# Patient Record
Sex: Female | Born: 1960 | Race: White | Hispanic: No | Smoking: Current every day smoker
Health system: Southern US, Community
[De-identification: ages and names within clinical notes are randomized; demographics above are authoritative.]

## PROBLEM LIST (undated history)

## (undated) DIAGNOSIS — F329 Major depressive disorder, single episode, unspecified: Secondary | ICD-10-CM

## (undated) DIAGNOSIS — G2581 Restless legs syndrome: Secondary | ICD-10-CM

## (undated) DIAGNOSIS — F32A Depression, unspecified: Secondary | ICD-10-CM

## (undated) DIAGNOSIS — J449 Chronic obstructive pulmonary disease, unspecified: Secondary | ICD-10-CM

## (undated) DIAGNOSIS — Z9889 Other specified postprocedural states: Secondary | ICD-10-CM

## (undated) HISTORY — DX: Major depressive disorder, single episode, unspecified: F32.9

## (undated) HISTORY — DX: Depression, unspecified: F32.A

## (undated) HISTORY — PX: CHOLECYSTECTOMY: SHX55

## (undated) HISTORY — DX: Other specified postprocedural states: Z98.890

## (undated) HISTORY — DX: Chronic obstructive pulmonary disease, unspecified: J44.9

## (undated) HISTORY — PX: OTHER SURGICAL HISTORY: SHX169

## (undated) HISTORY — PX: TUBAL LIGATION: SHX77

---

## 2008-02-03 ENCOUNTER — Emergency Department (HOSPITAL_COMMUNITY): Admission: EM | Admit: 2008-02-03 | Discharge: 2008-02-03 | Payer: Self-pay | Admitting: Emergency Medicine

## 2011-04-14 ENCOUNTER — Encounter: Payer: Self-pay | Admitting: *Deleted

## 2011-04-14 DIAGNOSIS — K5900 Constipation, unspecified: Secondary | ICD-10-CM | POA: Insufficient documentation

## 2011-04-14 DIAGNOSIS — Z5320 Procedure and treatment not carried out because of patient's decision for unspecified reasons: Secondary | ICD-10-CM | POA: Insufficient documentation

## 2011-04-14 LAB — GLUCOSE, CAPILLARY: Glucose-Capillary: 242 mg/dL — ABNORMAL HIGH (ref 70–99)

## 2011-04-14 NOTE — ED Notes (Signed)
Reports no BM x 3 weeks; was seen and tx at another ER for same; states has appt with GI specialist here in 2 days, but cannot wait d/t upper abd pain

## 2011-04-15 ENCOUNTER — Emergency Department (HOSPITAL_COMMUNITY)
Admission: EM | Admit: 2011-04-15 | Discharge: 2011-04-15 | Discharge status: Against Medical Advice | Payer: Self-pay | Attending: *Deleted | Admitting: *Deleted

## 2011-04-15 NOTE — ED Notes (Signed)
Unable to locate pt in all waiting  Areas.

## 2011-04-16 ENCOUNTER — Encounter: Payer: Self-pay | Admitting: Gastroenterology

## 2011-04-16 ENCOUNTER — Ambulatory Visit (INDEPENDENT_AMBULATORY_CARE_PROVIDER_SITE_OTHER): Payer: Self-pay | Admitting: Gastroenterology

## 2011-04-16 DIAGNOSIS — K5900 Constipation, unspecified: Secondary | ICD-10-CM

## 2011-04-16 DIAGNOSIS — R109 Unspecified abdominal pain: Secondary | ICD-10-CM

## 2011-04-16 MED ORDER — LUBIPROSTONE 24 MCG PO CAPS: 24.0000 ug | ORAL_CAPSULE | Freq: Two times a day (BID) | ORAL | Status: DC

## 2011-04-16 MED ORDER — OMEPRAZOLE 20 MG PO CPDR: 20.0000 mg | DELAYED_RELEASE_CAPSULE | Freq: Every day | ORAL | Status: DC

## 2011-04-16 MED ORDER — LUBIPROSTONE 8 MCG PO CAPS: 8.0000 ug | ORAL_CAPSULE | Freq: Two times a day (BID) | ORAL | Status: DC

## 2011-04-16 MED ORDER — LUBIPROSTONE 24 MCG PO CAPS: 24.0000 ug | ORAL_CAPSULE | Freq: Two times a day (BID) | ORAL | Status: AC

## 2011-04-16 NOTE — Assessment & Plan Note (Signed)
Vague upper abdominal pain for past few months, now exacerbated with onset of constipation. Worsened with eating/drinking. Denies dysphagia or odynophagia. Nausea associated with constipation. At this time, may have underlying gastritis, PUD or symptoms may be stemming from increased stool burden. Will empirically place on PPI. Pt has no insurance, so we will go with Prilosec for now as it may be most cost-efficient. Labs are wnl from [place]. We will reassess discomfort in 6 weeks or sooner if needed. Once bowel habits are regulated, may have clearer picture of what is going on.

## 2011-04-16 NOTE — Patient Instructions (Addendum)
Stop Donnatal.   Start High fiber diet.  See handout. AVOID greasy, fatty foods and caffeine. Try to drink at least 6-8 glasses of water daily. This will greatly improve your bowel habits.  Start taking Prilosec 30 minutes before breakfast daily. Call us in 10 days if no improvement in your symptoms. Constipation may be aggravating these symptoms, but we will pursue further work-up if you have no change after taking the medicine and improving your bowel habits.  Start taking Amitiza 24 mcg with dinner for 3 nights, then increase to twice a day WITH FOOD TO AVOID NAUSEA. This dose can be increased if necessary, but you will need to contact us if you find you are not having adequate results.   Start taking a probiotic daily. You can get this over the counter [facility], [employer], [employer]). This will help your symptoms of constipation and bloating.  We will see you back in 6 weeks.

## 2011-04-16 NOTE — Progress Notes (Signed)
Cc to PCP 

## 2011-04-16 NOTE — Assessment & Plan Note (Signed)
50 year old Caucasian female with hx of chronic constipation in the past, now with recent 3 week episode of severe constipation/obstipation resulting in presentation to [facility] ED. AAS nl, labs essentially wnl. She has tried multiple agents in the past and recently to include mag citrate, miralax, MOM, prune juice. She admits to eating whatever she wants, poor water intake, drinks mainly caffeinated beverages such as coffee. She has had a colonoscopy in the past 2 years with Dr. Allena Katz in [place], [place]. These reports are being requested. +Abdominal bloating and distension have significantly improved since 2 small BMs in the past few days but still remains. No melena or brbpr. +Nausea. +upper abdominal discomfort, which has been present for the past few months prior to episode but has worsened since. May be r/t constipation discomfort, but please see notes under "abdominal pain". Constipation is most likely a result of dietary habits, lack of adequate bowel regimen. Pt is clinically improved since seen at [facility]. Will stop Donnatal for now as it is likely worsening constipation.   High fiber diet, may take supplements if desires 6-8 glasses of water daily. Avoid caffeinated beverages Stop Donnatal Begin Amitiza 24 mcg. See pt instructions Probiotic daily Contact us if increased abdominal pain, bloating, constipation Retrieve colonoscopy reports for our records Follow-up in 6 weeks

## 2011-04-16 NOTE — Progress Notes (Signed)
Referring Provider: No ref. provider found Primary Care Physician:  Reynolds Bowl, MD Primary Gastroenterologist:  Dr. Darrick Penna   Chief Complaint  Patient presents with  . Constipation    HPI:   Stephanie Khan is a 50 year old Caucasian female who presents at the request of [facility] secondary to severe constipation. She had a colonoscopy approximately 2 years ago by Dr. Allena Katz in [place], [place]. Reports are not available at this visit. Stephanie Khan reports a hx of chronic constipation in the past; however, she states that for the past 3 weeks she has had increasing constipation, abdominal bloating, upper abdominal discomfort, prompting a visit to [facility] for relief. She was given Donnatal for spasms. Labs drawn as well as AAS. Please see findings below. She states that she has had a small amount of relief in the past two days, passing 2 small bowel movements. No melena or brbpr. Abdominal distension significantly decreased, yet she is still uncomfortable. Slight nausea due to increased abdominal pressure secondary to constipation.   During this severe episode of constipation,she has tried mag citrate, MOM, miralax, prune juice, and enemas. She was unable to experience relief. As noted above, she did have 2 episodes of 2 small BMs with slight relief of symptoms. She is passing gas. No melena or brbpr.   She also complains of upper abdominal pain, worsened with eating/drinking. She noted this for the past few months, prior to constipation episode, but it has worsened since then. Denies dysphagia, odynophagia, or reflux. No PPI currently. No hx of PUD, denies NSAIDs or aspirin powders.  She does not appear in distress currently. She is quite pleasant.    AAS July 16: non-obstructed bowel gas pattern, no free air. Low lung volumes with atelectasis. Labs: Lipase 52, LFTs nl, Glucose 227, Hgb 12.7, WBC 7.8, hemoccult neg in ED  Past Medical History  Diagnosis Date  . Diabetes mellitus     . COPD (chronic obstructive pulmonary disease)   . Depression   . S/P colonoscopy     Dr. Allena Katz in [place] 2 years ago  . Asthma     Past Surgical History  Procedure Date  . Cholecystectomy   . Tubal ligation   . Back operation     L4-L5    Current Outpatient Prescriptions  Medication Sig Dispense Refill  . acetaminophen (TYLENOL) 500 MG tablet Take 500 mg by mouth daily as needed. For pain       . aspirin EC 81 MG tablet Take 81 mg by mouth daily.        . clonazePAM (KLONOPIN) 1 MG tablet Take 1 mg by mouth 2 (two) times daily as needed. For anxiety/depression       . glipiZIDE (GLUCOTROL XL) 10 MG 24 hr tablet Take 10 mg by mouth daily.        . metFORMIN (GLUCOPHAGE) 1000 MG tablet Take 1,000 mg by mouth 2 (two) times daily with a meal.        . sitaGLIPtin (JANUVIA) 100 MG tablet Take 100 mg by mouth daily.        Marland Kitchen lubiprostone (AMITIZA) 24 MCG capsule Take 1 capsule (24 mcg total) by mouth 2 (two) times daily with a meal. Start by taking 1 capsule WITH Food each evening for the first 3 days. Then, increase to taking once in morning WITH FOOD and once in evening WITH FOOD.  62 capsule  1  . omeprazole (PRILOSEC) 20 MG capsule Take 1 capsule (20 mg total) by mouth daily.  30 capsule  0    Allergies as of 04/16/2011  . (No Known Allergies)    Family History  Problem Relation Age of Onset  . Colon cancer Neg Hx     History   Social History  . Marital Status: Married    Spouse Name: N/A    Number of Children: N/A  . Years of Education: N/A   Occupational History  . [employer]     CNA    Social History Main Topics  . Smoking status: Current Everyday Smoker -- 1.0 packs/day for 35 years    Types: Cigarettes  . Smokeless tobacco: Not on file  . Alcohol Use: No  . Drug Use: No  . Sexually Active: Not on file    Review of Systems: Gen: Denies any fever, chills, loss of appetite, fatigue, weight loss. CV: Denies chest pain, heart palpitations,  syncope, peripheral edema. Resp: Denies shortness of breath with rest, cough, wheezing GI: Denies dysphagia or odynophagia. Denies hematemesis, fecal incontinence, or jaundice.  GU : Denies urinary burning, urinary frequency, urinary incontinence.  MS: Denies joint pain, muscle weakness, cramps, limited movement Derm: Denies rash, itching, dry skin Psych: Denies depression, anxiety, confusion or memory loss  Heme: Denies bruising, bleeding, and enlarged lymph nodes.  Physical Exam: BP 126/83  Pulse 84  Temp(Src) 97.6 F (36.4 C) (Temporal)  Ht 5\' 4"  (1.626 m)  Wt 226 lb 3.2 oz (102.604 kg)  BMI 38.83 kg/m2 General:   Alert and oriented. Well-developed, well-nourished, pleasant and cooperative. Obese.  Head:  Normocephalic and atraumatic. Eyes:  Conjunctiva pink, sclera clear, no icterus.   Conjunctiva pink. Ears:  Hearing aids bilaterally, decreased hearing.  Nose:  No deformity, discharge,  or lesions. Mouth:  No deformity or lesions, mucosa pink and moist.  Neck:  Supple, without mass or thyromegaly. Lungs:  Clear to auscultation bilaterally, without wheezing, rales, or rhonchi.  Heart:  S1, S2 present without murmurs noted.  Abdomen:  Obese, soft, tender to palpation upper abdomen, epigastric region. non-distended. Without mass or HSM. No rebound or guarding. No hernias noted. She has somewhat hypoactive BS.  Rectal:  External exam without signs of hemorrhoids; internal exam with good sphincter tone, no mass noted, no stool in rectal vault.  Msk:  Symmetrical without gross deformities. Normal posture. Extremities:  Without clubbing or edema. Neurologic:  Alert and  oriented x4;  grossly normal neurologically. Skin:  Intact, warm and dry without significant lesions or rashes Cervical Nodes:  No significant cervical adenopathy. Psych:  Alert and cooperative. Normal mood and affect.

## 2011-04-21 ENCOUNTER — Telehealth: Payer: Self-pay

## 2011-04-21 NOTE — Telephone Encounter (Signed)
Has she started the Amitiza?  Ask her: is she passing gas?               Abdominal bloating?               N/V?  Let me know what she says. We can do an AAS today to assess for any obstruction. I would like to hold on the Amitiza until we get results from AAS.   May use fleets enema X 1 to get things rolling.

## 2011-04-21 NOTE — Telephone Encounter (Signed)
Patient is still having very bad pain and can not go to the bathroom. She is able to pass very little stool. She thinks she need to have a test to see if she has something going on. She would like to be seen today to address the pain. Please advised

## 2011-04-21 NOTE — Telephone Encounter (Signed)
She is taking the Amitiza and passing gas,she does had abdominal bloating but no N/V. She stated that she did have a bowel movement after I talked to her this morning. She has pain after she eats in the top of her stomach. She has tried a soap sud enema last night with not results and this morning she used a fleets enema with average results. Want to know if an anti-bx would help her.

## 2011-04-21 NOTE — Telephone Encounter (Signed)
abx are not indicated in this scenario. We need to give the Amitiza time to work. Please inform pt that we also will probably be needing to perform an upper endoscopy if no improvement in the next week or so after changes in medication. Have her call us back Monday morning with PR.

## 2011-04-23 ENCOUNTER — Telehealth: Payer: Self-pay

## 2011-04-23 NOTE — Telephone Encounter (Signed)
Pt is taking the Prilosec as directed. She is not having any n/v. Just the abd pain. She has spoke with Lubertha Basque, but she will touch base with her and let us know what she says. She has never had an endoscopy.

## 2011-04-23 NOTE — Telephone Encounter (Signed)
Just want to verify: abdominal pain lower abdomen or upper abdomen?

## 2011-04-23 NOTE — Telephone Encounter (Signed)
Pt says she is hurting in her upper abdomen tight below her breasts.

## 2011-04-23 NOTE — Telephone Encounter (Signed)
Pt called and said she is still having the abdominal pain every time after she eats anything. She is taking the Prilosec, Amitiza and the probiotic as directed.  I told her that Gerrit Halls, NP had wanted her to try the meds for a couple of weeks and see if there was any improvement in her symptoms. Having a normal BM once a day now, Please advise!

## 2011-04-23 NOTE — Telephone Encounter (Signed)
Make sure she is taking the Prilosec 30 minutes before breakfast on an empty stomach daily. Is she still nauseated? I don't believe she has ever had an upper endoscopy. We may need to set her up for this due to new onset dyspepsia.  Does she have insurance? Needs to speak to [facility] if not. In the interim, avoid spicy foods, things that aggravated. Stay away from fatty foods.

## 2011-04-24 ENCOUNTER — Other Ambulatory Visit: Payer: Self-pay

## 2011-04-24 NOTE — Telephone Encounter (Signed)
Routing to Anna Sams, NP. 

## 2011-04-24 NOTE — Telephone Encounter (Signed)
I would like to get a CT abd for her, updated lipase. LFTs were normal. She will need an EGD if CT is negative. She is having no dysphagia or reflux from what I remember.  She needs to get this approved through the [place] discount. It would be good to have it done soon if possible, for our review.

## 2011-04-27 NOTE — Progress Notes (Signed)
AGREE

## 2011-05-04 NOTE — Telephone Encounter (Signed)
Called pt, many rings and no answer.  

## 2011-05-06 NOTE — Telephone Encounter (Signed)
LMOM to call.

## 2011-05-11 NOTE — Telephone Encounter (Signed)
LMOM to call. Mailing a letter also to call.  

## 2011-05-26 ENCOUNTER — Ambulatory Visit: Payer: Self-pay | Admitting: Gastroenterology

## 2011-05-26 ENCOUNTER — Telehealth: Payer: Self-pay | Admitting: Gastroenterology

## 2011-05-29 NOTE — Telephone Encounter (Signed)
Routed to provider

## 2011-06-17 LAB — DIFFERENTIAL
Basophils Absolute: 0
Eosinophils Relative: 3
Lymphocytes Relative: 31
Lymphs Abs: 2.6
Monocytes Absolute: 0.6
Monocytes Relative: 7

## 2011-06-17 LAB — URINALYSIS, ROUTINE W REFLEX MICROSCOPIC
Glucose, UA: NEGATIVE
Leukocytes, UA: NEGATIVE
pH: 5

## 2011-06-17 LAB — CBC
MCV: 87.5
Platelets: 225
WBC: 8.6

## 2011-06-17 LAB — URINE MICROSCOPIC-ADD ON

## 2011-06-17 LAB — COMPREHENSIVE METABOLIC PANEL
AST: 32
Albumin: 3.6
Chloride: 107
Creatinine, Ser: 0.52
GFR calc Af Amer: >60
Total Bilirubin: 0.4
Total Protein: 6.4

## 2011-06-23 NOTE — Telephone Encounter (Signed)
First no-show. Please offer f/u appt letter.

## 2011-06-29 ENCOUNTER — Encounter: Payer: Self-pay | Admitting: Gastroenterology

## 2011-06-29 NOTE — Telephone Encounter (Signed)
Mailed reminder letter for pt to call and set up OV

## 2015-12-09 ENCOUNTER — Encounter (INDEPENDENT_AMBULATORY_CARE_PROVIDER_SITE_OTHER): Payer: Self-pay | Admitting: *Deleted

## 2015-12-18 ENCOUNTER — Other Ambulatory Visit (HOSPITAL_COMMUNITY)
Admission: RE | Admit: 2015-12-18 | Discharge: 2015-12-18 | Disposition: A | Discharge status: Home / Self Care | Payer: Self-pay | Source: Ambulatory Visit | Attending: Obstetrics & Gynecology | Admitting: Obstetrics & Gynecology

## 2015-12-18 ENCOUNTER — Encounter: Payer: Self-pay | Admitting: Obstetrics & Gynecology

## 2015-12-18 ENCOUNTER — Ambulatory Visit (INDEPENDENT_AMBULATORY_CARE_PROVIDER_SITE_OTHER): Payer: BLUE CROSS/BLUE SHIELD | Admitting: Obstetrics & Gynecology

## 2015-12-18 VITALS — BP 102/70 | HR 72 | Ht 64.0 in | Wt 194.0 lb

## 2015-12-18 DIAGNOSIS — Z01419 Encounter for gynecological examination (general) (routine) without abnormal findings: Secondary | ICD-10-CM | POA: Insufficient documentation

## 2015-12-18 DIAGNOSIS — Z1212 Encounter for screening for malignant neoplasm of rectum: Secondary | ICD-10-CM | POA: Diagnosis not present

## 2015-12-18 DIAGNOSIS — Z1151 Encounter for screening for human papillomavirus (HPV): Secondary | ICD-10-CM | POA: Insufficient documentation

## 2015-12-18 DIAGNOSIS — Z1211 Encounter for screening for malignant neoplasm of colon: Secondary | ICD-10-CM

## 2015-12-18 NOTE — Progress Notes (Signed)
Patient ID: Stephanie Khan, female   DOB: 16-Apr-1961, 54 y.o.   MRN: [id number] Subjective:     Stephanie Khan is a 55 y.o. female here for a routine exam.  No LMP recorded. Patient is postmenopausal. No obstetric history on file. Birth Control Method:  Post menopausal Menstrual Calendar(currently): amenorrheic  Current complaints: occasional fullness feeling like.   Current acute medical issues:  none   Recent Gynecologic History No LMP recorded. Patient is postmenopausal. Last Pap: ?,  normal Last mammogram: 2015,  normal  Past Medical History  Diagnosis Date  . Diabetes mellitus   . COPD (chronic obstructive pulmonary disease) ([place])   . Depression   . S/P colonoscopy     Dr. Posey Pronto in [place] 2 years ago  . Asthma     Past Surgical History  Procedure Laterality Date  . Cholecystectomy    . Tubal ligation    . Back operation      L4-L5    OB History    No data available      Social History   Social History  . Marital Status: Married    Spouse Name: N/A  . Number of Children: N/A  . Years of Education: N/A   Occupational History  . [employer]     CNA    Social History Main Topics  . Smoking status: Current Every Day Smoker -- 1.00 packs/day for 35 years    Types: Cigarettes  . Smokeless tobacco: None  . Alcohol Use: No  . Drug Use: No  . Sexual Activity: Not Asked   Other Topics Concern  . None   Social History Narrative    Family History  Problem Relation Age of Onset  . Colon cancer Neg Hx   . Diabetes Mother   . Diabetes Maternal Aunt      Current outpatient prescriptions:  .  aspirin EC 81 MG tablet, Take 81 mg by mouth daily.  , Disp: , Rfl:  .  clonazePAM (KLONOPIN) 1 MG tablet, Take 1 mg by mouth 2 (two) times daily as needed. For anxiety/depression , Disp: , Rfl:  .  insulin NPH Human (HUMULIN N,NOVOLIN N) 100 UNIT/ML injection, Inject 22 Units into the skin., Disp: , Rfl:  .  metFORMIN (GLUCOPHAGE) 1000 MG tablet,  Take 1,000 mg by mouth 2 (two) times daily with a meal.  , Disp: , Rfl:  .  omeprazole (PRILOSEC) 20 MG capsule, Take 1 capsule (20 mg total) by mouth daily., Disp: 30 capsule, Rfl: 0  Review of Systems  Review of Systems  Constitutional: Negative for fever, chills, weight loss, malaise/fatigue and diaphoresis.  HENT: Negative for hearing loss, ear pain, nosebleeds, congestion, sore throat, neck pain, tinnitus and ear discharge.   Eyes: Negative for blurred vision, double vision, photophobia, pain, discharge and redness.  Respiratory: Negative for cough, hemoptysis, sputum production, shortness of breath, wheezing and stridor.   Cardiovascular: Negative for chest pain, palpitations, orthopnea, claudication, leg swelling and PND.  Gastrointestinal: negative for abdominal pain. Negative for heartburn, nausea, vomiting, diarrhea, constipation, blood in stool and melena.  Genitourinary: Negative for dysuria, urgency, frequency, hematuria and flank pain.  Musculoskeletal: Negative for myalgias, back pain, joint pain and falls.  Skin: Negative for itching and rash.  Neurological: Negative for dizziness, tingling, tremors, sensory change, speech change, focal weakness, seizures, loss of consciousness, weakness and headaches.  Endo/Heme/Allergies: Negative for environmental allergies and polydipsia. Does not bruise/bleed easily.  Psychiatric/Behavioral: Negative for depression, suicidal ideas, hallucinations,  memory loss and substance abuse. The patient is not nervous/anxious and does not have insomnia.        Objective:  Blood pressure 102/70, pulse 72, height 5\' 4"  (1.626 m), weight 194 lb (87.998 kg).   Physical Exam  Vitals reviewed. Constitutional: She is oriented to person, place, and time. She appears well-developed and well-nourished.   Vagina is pink moist without discharge Cervix normal in appearance and pap is done Uterus is normal size shape and contour Adnexa is negative with  normal sized ovaries  {Rectal    hemoccult negative, normal tone, no masses  Musculoskeletal: Normal range of motion. She exhibits no edema and no tenderness.  Neurological: She is alert and oriented to person, place, and time. She has normal reflexes. She displays normal reflexes. No cranial nerve deficit. She exhibits normal muscle tone. Coordination normal.  Skin: Skin is warm and dry. No rash noted. No erythema. No pallor.  Psychiatric: She has a normal mood and affect. Her behavior is normal. Judgment and thought content normal.       Assessment:    Healthy female exam.    Plan:    Mammogram ordered. Follow up in: 1 year.    Meds ordered this encounter  Medications  . insulin NPH Human (HUMULIN N,NOVOLIN N) 100 UNIT/ML injection    Sig: Inject 22 Units into the skin.    No orders of the defined types were placed in this encounter.

## 2015-12-20 LAB — CYTOLOGY - PAP

## 2016-01-09 ENCOUNTER — Other Ambulatory Visit (INDEPENDENT_AMBULATORY_CARE_PROVIDER_SITE_OTHER): Payer: Self-pay | Admitting: Internal Medicine

## 2016-01-09 ENCOUNTER — Encounter (INDEPENDENT_AMBULATORY_CARE_PROVIDER_SITE_OTHER): Payer: Self-pay | Admitting: *Deleted

## 2016-01-09 ENCOUNTER — Other Ambulatory Visit (INDEPENDENT_AMBULATORY_CARE_PROVIDER_SITE_OTHER): Payer: Self-pay | Admitting: *Deleted

## 2016-01-09 DIAGNOSIS — Z1211 Encounter for screening for malignant neoplasm of colon: Secondary | ICD-10-CM

## 2016-01-09 NOTE — Telephone Encounter (Signed)
Patient needs trilyte 

## 2016-01-14 MED ORDER — PEG 3350-KCL-NA BICARB-NACL 420 G PO SOLR: 4000.0000 mL | Freq: Once | ORAL | Status: DC

## 2016-01-16 ENCOUNTER — Telehealth (INDEPENDENT_AMBULATORY_CARE_PROVIDER_SITE_OTHER): Payer: Self-pay | Admitting: *Deleted

## 2016-01-16 NOTE — Telephone Encounter (Signed)
Referring MD/PCP: [facility]   Procedure: tcs w propofol  Reason/Indication:  screening  Has patient had this procedure before?  Yes, over 10 yrs ago  If so, when, by whom and where?    Is there a family history of colon cancer?  no  Who?  What age when diagnosed?    Is patient diabetic?   yes      Does patient have prosthetic heart valve or mechanical valve?  no  Do you have a pacemaker?  no  Has patient ever had endocarditis? no  Has patient had joint replacement within last 12 months?  no  Does patient tend to be constipated or take laxatives? no  Does patient have a history of alcohol/drug use?  no  Is patient on Coumadin, Plavix and/or Aspirin? yes  Medications: asa 81 mg daily, metformin 1000 mg bid, clonazepam 0.5 mg daily, lantus 22 units, percocet 75 mg bid, centrum silver   Allergies: nkda  Medication Adjustment: asa 2 days   Procedure date & time: 02/14/16 at 1125

## 2016-01-17 NOTE — Telephone Encounter (Signed)
agree

## 2016-02-06 NOTE — Patient Instructions (Signed)
Stephanie Khan  02/06/2016     @PREFPERIOPPHARMACY @   Your procedure is scheduled on 02/14/2016.  Report to [name] at 9:55 A.M.  Call this number if you have problems the morning of surgery:  [phone]   Remember:  Do not eat food or drink liquids after midnight.  Take these medicines the morning of surgery with A SIP OF WATER Klonopin, Oxycodone, Lyrica  DO NOT TAKE DIABETIC MEDICATION DAY OF PROCEDURE  TAKE ONLY 1/2 DOSE OF INSULIN EVENING PRIOR TO PROCEDURE   Do not wear jewelry, make-up or nail polish.  Do not wear lotions, powders, or perfumes.  You may wear deodorant.  Do not shave 48 hours prior to surgery.  Men may shave face and neck.  Do not bring valuables to the hospital.  [facility] is not responsible for any belongings or valuables.  Contacts, dentures or bridgework may not be worn into surgery.  Leave your suitcase in the car.  After surgery it may be brought to your room.  For patients admitted to the hospital, discharge time will be determined by your treatment team.  Patients discharged the day of surgery will not be allowed to drive home.    Please read over the following fact sheets that you were given. Anesthesia Post-op Instructions     PATIENT INSTRUCTIONS POST-ANESTHESIA  IMMEDIATELY FOLLOWING SURGERY:  Do not drive or operate machinery for the first twenty four hours after surgery.  Do not make any important decisions for twenty four hours after surgery or while taking narcotic pain medications or sedatives.  If you develop intractable nausea and vomiting or a severe headache please notify your doctor immediately.  FOLLOW-UP:  Please make an appointment with your surgeon as instructed. You do not need to follow up with anesthesia unless specifically instructed to do so.  WOUND CARE INSTRUCTIONS (if applicable):  Keep a dry clean dressing on the anesthesia/puncture wound site if there is drainage.  Once the wound has quit draining you may  leave it open to air.  Generally you should leave the bandage intact for twenty four hours unless there is drainage.  If the epidural site drains for more than 36-48 hours please call the anesthesia department.  QUESTIONS?:  Please feel free to call your physician or the hospital operator if you have any questions, and they will be happy to assist you.      Colonoscopy A colonoscopy is an exam to look at the entire large intestine (colon). This exam can help find problems such as tumors, polyps, inflammation, and areas of bleeding. The exam takes about 1 hour.  LET [facility] CARE PROVIDER KNOW ABOUT:   Any allergies you have.  All medicines you are taking, including vitamins, herbs, eye drops, creams, and over-the-counter medicines.  Previous problems you or members of your family have had with the use of anesthetics.  Any blood disorders you have.  Previous surgeries you have had.  Medical conditions you have. RISKS AND COMPLICATIONS  Generally, this is a safe procedure. However, as with any procedure, complications can occur. Possible complications include:  Bleeding.  Tearing or rupture of the colon wall.  Reaction to medicines given during the exam.  Infection (rare). BEFORE THE PROCEDURE   Ask your health care provider about changing or stopping your regular medicines.  You may be prescribed an oral bowel prep. This involves drinking a large amount of medicated liquid, starting the day before your procedure. The liquid will cause you  to have multiple loose stools until your stool is almost clear or light green. This cleans out your colon in preparation for the procedure.  Do not eat or drink anything else once you have started the bowel prep, unless your health care provider tells you it is safe to do so.  Arrange for someone to drive you home after the procedure. PROCEDURE   You will be given medicine to help you relax (sedative).  You will lie on your side with  your knees bent.  A long, flexible tube with a light and camera on the end (colonoscope) will be inserted through the rectum and into the colon. The camera sends video back to a computer screen as it moves through the colon. The colonoscope also releases carbon dioxide gas to inflate the colon. This helps your health care provider see the area better.  During the exam, your health care provider may take a small tissue sample (biopsy) to be examined under a microscope if any abnormalities are found.  The exam is finished when the entire colon has been viewed. AFTER THE PROCEDURE   Do not drive for 24 hours after the exam.  You may have a small amount of blood in your stool.  You may pass moderate amounts of gas and have mild abdominal cramping or bloating. This is caused by the gas used to inflate your colon during the exam.  Ask when your test results will be ready and how you will get your results. Make sure you get your test results.   This information is not intended to replace advice given to you by your health care provider. Make sure you discuss any questions you have with your health care provider.   Document Released: 09/04/2000 Document Revised: 06/28/2013 Document Reviewed: 05/15/2013 Elsevier Interactive Patient Education [employer].

## 2016-02-10 ENCOUNTER — Other Ambulatory Visit: Payer: Self-pay

## 2016-02-10 ENCOUNTER — Encounter (HOSPITAL_COMMUNITY)
Admission: RE | Admit: 2016-02-10 | Discharge: 2016-02-10 | Disposition: A | Discharge status: Home / Self Care | Payer: BLUE CROSS/BLUE SHIELD | Source: Ambulatory Visit | Attending: Internal Medicine | Admitting: Internal Medicine

## 2016-02-10 ENCOUNTER — Encounter (HOSPITAL_COMMUNITY): Payer: Self-pay

## 2016-02-10 VITALS — BP 125/68 | HR 72 | Temp 97.7°F | Resp 18 | Ht 64.0 in | Wt 197.0 lb

## 2016-02-10 DIAGNOSIS — Z01812 Encounter for preprocedural laboratory examination: Secondary | ICD-10-CM | POA: Diagnosis present

## 2016-02-10 DIAGNOSIS — M359 Systemic involvement of connective tissue, unspecified: Secondary | ICD-10-CM | POA: Insufficient documentation

## 2016-02-10 DIAGNOSIS — Z1211 Encounter for screening for malignant neoplasm of colon: Secondary | ICD-10-CM

## 2016-02-10 DIAGNOSIS — Z01810 Encounter for preprocedural cardiovascular examination: Secondary | ICD-10-CM | POA: Insufficient documentation

## 2016-02-10 HISTORY — DX: Restless legs syndrome: G25.81

## 2016-02-10 LAB — BASIC METABOLIC PANEL
ANION GAP: 8 (ref 5–15)
BUN: 11 mg/dL (ref 6–20)
CALCIUM: 9.5 mg/dL (ref 8.9–10.3)
CO2: 25 mmol/L (ref 22–32)
Chloride: 103 mmol/L (ref 101–111)
Creatinine, Ser: 0.35 mg/dL — ABNORMAL LOW (ref 0.44–1.00)
GFR calc Af Amer: >60 mL/min (ref >60)
GFR calc non Af Amer: >60 mL/min (ref >60)
GLUCOSE: 155 mg/dL — AB (ref 65–99)
Potassium: 4.3 mmol/L (ref 3.5–5.1)
Sodium: 136 mmol/L (ref 135–145)

## 2016-02-10 LAB — CBC WITH DIFFERENTIAL/PLATELET
BASOS ABS: 0.1 10*3/uL (ref 0.0–0.1)
Basophils Relative: 1 %
Eosinophils Absolute: 0.2 10*3/uL (ref 0.0–0.7)
Eosinophils Relative: 3 %
HEMATOCRIT: 41.6 % (ref 36.0–46.0)
Hemoglobin: 13.8 g/dL (ref 12.0–15.0)
LYMPHS ABS: 2.4 10*3/uL (ref 0.7–4.0)
LYMPHS PCT: 30 %
MCH: 29.7 pg (ref 26.0–34.0)
MCHC: 33.2 g/dL (ref 30.0–36.0)
MCV: 89.5 fL (ref 78.0–100.0)
MONO ABS: 0.6 10*3/uL (ref 0.1–1.0)
MONOS PCT: 7 %
NEUTROS ABS: 4.6 10*3/uL (ref 1.7–7.7)
Neutrophils Relative %: 59 %
Platelets: 257 10*3/uL (ref 150–400)
RBC: 4.65 MIL/uL (ref 3.87–5.11)
RDW: 13.5 % (ref 11.5–15.5)
WBC: 7.8 10*3/uL (ref 4.0–10.5)

## 2016-02-10 NOTE — Pre-Procedure Instructions (Signed)
Patient given information to sign up for my chart at home. 

## 2016-02-14 ENCOUNTER — Encounter (HOSPITAL_COMMUNITY)
Admission: RE | Disposition: A | Discharge status: Home / Self Care | Payer: Self-pay | Source: Ambulatory Visit | Attending: Internal Medicine

## 2016-02-14 ENCOUNTER — Ambulatory Visit (HOSPITAL_COMMUNITY): Payer: BLUE CROSS/BLUE SHIELD | Admitting: Anesthesiology

## 2016-02-14 ENCOUNTER — Ambulatory Visit (HOSPITAL_COMMUNITY)
Admission: RE | Admit: 2016-02-14 | Discharge: 2016-02-14 | Disposition: A | Discharge status: Home / Self Care | Payer: BLUE CROSS/BLUE SHIELD | Source: Ambulatory Visit | Attending: Internal Medicine | Admitting: Internal Medicine

## 2016-02-14 ENCOUNTER — Encounter (HOSPITAL_COMMUNITY): Payer: Self-pay | Admitting: *Deleted

## 2016-02-14 DIAGNOSIS — Z7982 Long term (current) use of aspirin: Secondary | ICD-10-CM | POA: Diagnosis not present

## 2016-02-14 DIAGNOSIS — D1779 Benign lipomatous neoplasm of other sites: Secondary | ICD-10-CM | POA: Diagnosis not present

## 2016-02-14 DIAGNOSIS — Z794 Long term (current) use of insulin: Secondary | ICD-10-CM | POA: Insufficient documentation

## 2016-02-14 DIAGNOSIS — F329 Major depressive disorder, single episode, unspecified: Secondary | ICD-10-CM | POA: Insufficient documentation

## 2016-02-14 DIAGNOSIS — F17210 Nicotine dependence, cigarettes, uncomplicated: Secondary | ICD-10-CM | POA: Insufficient documentation

## 2016-02-14 DIAGNOSIS — D125 Benign neoplasm of sigmoid colon: Secondary | ICD-10-CM | POA: Insufficient documentation

## 2016-02-14 DIAGNOSIS — G2581 Restless legs syndrome: Secondary | ICD-10-CM | POA: Diagnosis not present

## 2016-02-14 DIAGNOSIS — E119 Type 2 diabetes mellitus without complications: Secondary | ICD-10-CM | POA: Diagnosis not present

## 2016-02-14 DIAGNOSIS — Z09 Encounter for follow-up examination after completed treatment for conditions other than malignant neoplasm: Secondary | ICD-10-CM

## 2016-02-14 DIAGNOSIS — K644 Residual hemorrhoidal skin tags: Secondary | ICD-10-CM | POA: Insufficient documentation

## 2016-02-14 DIAGNOSIS — J449 Chronic obstructive pulmonary disease, unspecified: Secondary | ICD-10-CM | POA: Diagnosis not present

## 2016-02-14 DIAGNOSIS — Z1211 Encounter for screening for malignant neoplasm of colon: Secondary | ICD-10-CM | POA: Diagnosis not present

## 2016-02-14 DIAGNOSIS — D123 Benign neoplasm of transverse colon: Secondary | ICD-10-CM | POA: Diagnosis not present

## 2016-02-14 DIAGNOSIS — Z7984 Long term (current) use of oral hypoglycemic drugs: Secondary | ICD-10-CM | POA: Diagnosis not present

## 2016-02-14 DIAGNOSIS — D175 Benign lipomatous neoplasm of intra-abdominal organs: Secondary | ICD-10-CM | POA: Diagnosis not present

## 2016-02-14 DIAGNOSIS — Z86010 Personal history of colonic polyps: Secondary | ICD-10-CM

## 2016-02-14 DIAGNOSIS — J45909 Unspecified asthma, uncomplicated: Secondary | ICD-10-CM | POA: Diagnosis not present

## 2016-02-14 HISTORY — PX: COLONOSCOPY WITH PROPOFOL: SHX5780

## 2016-02-14 HISTORY — PX: POLYPECTOMY: SHX5525

## 2016-02-14 LAB — GLUCOSE, CAPILLARY
Glucose-Capillary: 133 mg/dL — ABNORMAL HIGH (ref 65–99)
Glucose-Capillary: 118 mg/dL — ABNORMAL HIGH (ref 65–99)

## 2016-02-14 SURGERY — COLONOSCOPY WITH PROPOFOL
Anesthesia: Monitor Anesthesia Care

## 2016-02-14 MED ORDER — FENTANYL CITRATE (PF) 100 MCG/2ML IJ SOLN: 25.0000 ug | INTRAMUSCULAR | Status: DC | PRN

## 2016-02-14 MED ORDER — EPHEDRINE SULFATE 50 MG/ML IJ SOLN
INTRAMUSCULAR | Status: DC | PRN
  Administered 2016-02-14: 10 mg via INTRAVENOUS

## 2016-02-14 MED ORDER — PROPOFOL 500 MG/50ML IV EMUL
INTRAVENOUS | Status: DC | PRN
  Administered 2016-02-14: 125 ug/kg/min via INTRAVENOUS
  Administered 2016-02-14: 11:00:00 via INTRAVENOUS

## 2016-02-14 MED ORDER — SODIUM CHLORIDE 0.9 % IN NEBU
INHALATION_SOLUTION | RESPIRATORY_TRACT | Status: AC
  Filled 2016-02-14: qty 3

## 2016-02-14 MED ORDER — ALBUTEROL SULFATE (2.5 MG/3ML) 0.083% IN NEBU
2.5000 mg | INHALATION_SOLUTION | Freq: Once | RESPIRATORY_TRACT | Status: AC
  Administered 2016-02-14: 2.5 mg via RESPIRATORY_TRACT

## 2016-02-14 MED ORDER — ONDANSETRON HCL 4 MG/2ML IJ SOLN: 4.0000 mg | Freq: Once | INTRAMUSCULAR | Status: DC | PRN

## 2016-02-14 MED ORDER — MIDAZOLAM HCL 5 MG/5ML IJ SOLN
INTRAMUSCULAR | Status: DC | PRN
  Administered 2016-02-14: 2 mg via INTRAVENOUS

## 2016-02-14 MED ORDER — GLYCOPYRROLATE 0.2 MG/ML IJ SOLN
INTRAMUSCULAR | Status: AC
Start: 2016-02-14 — End: 2016-02-14
  Filled 2016-02-14: qty 1

## 2016-02-14 MED ORDER — PROPOFOL 10 MG/ML IV BOLUS
INTRAVENOUS | Status: AC
  Filled 2016-02-14: qty 40

## 2016-02-14 MED ORDER — FENTANYL CITRATE (PF) 100 MCG/2ML IJ SOLN
INTRAMUSCULAR | Status: AC
  Filled 2016-02-14: qty 2

## 2016-02-14 MED ORDER — MIDAZOLAM HCL 2 MG/2ML IJ SOLN
INTRAMUSCULAR | Status: AC
  Filled 2016-02-14: qty 2

## 2016-02-14 MED ORDER — FENTANYL CITRATE (PF) 100 MCG/2ML IJ SOLN
25.0000 ug | INTRAMUSCULAR | Status: AC
  Administered 2016-02-14: 25 ug via INTRAVENOUS

## 2016-02-14 MED ORDER — MIDAZOLAM HCL 2 MG/2ML IJ SOLN
1.0000 mg | INTRAMUSCULAR | Status: DC | PRN
  Administered 2016-02-14: 2 mg via INTRAVENOUS

## 2016-02-14 MED ORDER — GLYCOPYRROLATE 0.2 MG/ML IJ SOLN
0.2000 mg | Freq: Once | INTRAMUSCULAR | Status: AC
  Administered 2016-02-14: 0.2 mg via INTRAVENOUS

## 2016-02-14 MED ORDER — ALBUTEROL SULFATE (2.5 MG/3ML) 0.083% IN NEBU
INHALATION_SOLUTION | RESPIRATORY_TRACT | Status: AC
  Filled 2016-02-14: qty 3

## 2016-02-14 MED ORDER — LACTATED RINGERS IV SOLN
INTRAVENOUS | Status: DC
  Administered 2016-02-14: 10:00:00 via INTRAVENOUS

## 2016-02-14 NOTE — H&P (Signed)
Stephanie Khan is an 55 y.o. female.   Chief Complaint: Patient is here for colonoscopy. HPI: Patient is 55 year old Caucasian female who is here for colonoscopy. She is labeled as screening colonoscopy. However she states she had 3 polyps removed 13 years ago. She was advised to return for follow-up exam in 3 years but she decided not to. She has chronic constipation. She notices blood sometimes on the tissue with straining. She also has to support her peroneal wall in order to facilitate defecation. She has good appetite and her weight has been stable. She continues to smoke cigarettes. She smokes 1-1-1/2 pack per day. Family history is negative for CRC.  Past Medical History  Diagnosis Date  . Diabetes mellitus   . COPD (chronic obstructive pulmonary disease) ([place])   . Depression   . S/P colonoscopy     Dr. Posey Pronto in [place] 13 years ago;3 polyps removed.   . Asthma   . Restless leg     Past Surgical History  Procedure Laterality Date  . Cholecystectomy    . Tubal ligation    . Back operation      L4-L5    Family History  Problem Relation Age of Onset  . Colon cancer Neg Hx   . Diabetes Mother   . Diabetes Maternal Aunt    Social History:  reports that she has been smoking Cigarettes.  She has a 35 pack-year smoking history. She does not have any smokeless tobacco history on file. She reports that she drinks alcohol. She reports that she does not use illicit drugs.  Allergies: No Known Allergies  Medications Prior to Admission  Medication Sig Dispense Refill  . aspirin EC 81 MG tablet Take 81 mg by mouth daily.      . clonazePAM (KLONOPIN) 1 MG tablet Take 1 mg by mouth 2 (two) times daily as needed. For anxiety/depression     . insulin glargine (LANTUS) 100 UNIT/ML injection Inject 22 Units into the skin at bedtime.    . metFORMIN (GLUCOPHAGE) 1000 MG tablet Take 1,000 mg by mouth 2 (two) times daily with a meal.      . oxyCODONE-acetaminophen (PERCOCET) 7.5-325 MG tablet  Take 1 tablet by mouth 2 (two) times daily as needed for moderate pain.   0  . polyethylene glycol-electrolytes (NULYTELY/GOLYTELY) 420 g solution Take 4,000 mLs by mouth once. 4000 mL 0  . pregabalin (LYRICA) 50 MG capsule Take 50 mg by mouth daily.      Results for orders placed or performed during the hospital encounter of 02/14/16 (from the past 48 hour(s))  Glucose, capillary     Status: Abnormal   Collection Time: 02/14/16  9:23 AM  Result Value Ref Range   Glucose-Capillary 133 (H) 65 - 99 mg/dL   No results found.  ROS  Temperature 97.6 F (36.4 C), temperature source Oral. Physical Exam  Constitutional:  Well-developed well-nourished Caucasian female who is in no acute distress. She has hearing impairment.  HENT:  Mouth/Throat: Oropharynx is clear and moist.  She is edentulous. She has upper and lower dentures.  Eyes: No scleral icterus.  Neck: No thyromegaly present.  Cardiovascular: Normal rate, regular rhythm and normal heart sounds.   No murmur heard. Respiratory: Effort normal and breath sounds normal.  GI: Soft. She exhibits no distension and no mass. There is no tenderness.  Musculoskeletal: She exhibits no edema.  Neurological: She is alert.  Skin: Skin is warm and dry.     Assessment/Plan History of  colonic polyps. Last colonoscopy was 13 years ago. Surveillance colonoscopy.  Hildred Laser, MD 02/14/2016, 10:13 AM

## 2016-02-14 NOTE — Anesthesia Preprocedure Evaluation (Signed)
Anesthesia Evaluation  Patient identified by MRN, date of birth, ID band Patient awake    Reviewed: Allergy & Precautions, NPO status , Patient's Chart, lab work & pertinent test results  Airway Mallampati: II  TM Distance: >3 FB     Dental  (+) Edentulous Upper, Edentulous Lower   Pulmonary asthma , COPD, Current Smoker,    breath sounds clear to auscultation       Cardiovascular negative cardio ROS   Rhythm:Regular Rate:Normal     Neuro/Psych PSYCHIATRIC DISORDERS Depression    GI/Hepatic negative GI ROS,   Endo/Other  diabetes, Type 2, Oral Hypoglycemic Agents  Renal/GU      Musculoskeletal   Abdominal   Peds  Hematology   Anesthesia Other Findings   Reproductive/Obstetrics                             Anesthesia Physical Anesthesia Plan  ASA: III  Anesthesia Plan: MAC   Post-op Pain Management:    Induction: Intravenous  Airway Management Planned: Simple Face Mask  Additional Equipment:   Intra-op Plan:   Post-operative Plan:   Informed Consent: I have reviewed the patients History and Physical, chart, labs and discussed the procedure including the risks, benefits and alternatives for the proposed anesthesia with the patient or authorized representative who has indicated his/her understanding and acceptance.     Plan Discussed with:   Anesthesia Plan Comments:         Anesthesia Quick Evaluation

## 2016-02-14 NOTE — Op Note (Signed)
[facility] Patient Name: Stephanie Khan Procedure Date: 02/14/2016 10:05 AM MRN: [id number] Date of Birth: 06-27-1961 Attending MD: Hildred Laser , MD CSN: [id number] Age: 55 Admit Type: Outpatient Procedure:                Colonoscopy Indications:              High risk colon cancer surveillance: Personal                            history of colonic polyps Providers:                Hildred Laser, MD, Gwenlyn Fudge, RN, Isabella Stalling, Technician Referring MD:             [facility] Medicines:                Propofol per Anesthesia Complications:            No immediate complications. Estimated Blood Loss:     Estimated blood loss was minimal. Procedure:                Pre-Anesthesia Assessment:                           - Prior to the procedure, a History and Physical                            was performed, and patient medications and                            allergies were reviewed. The patient's tolerance of                            previous anesthesia was also reviewed. The risks                            and benefits of the procedure and the sedation                            options and risks were discussed with the patient.                            All questions were answered, and informed consent                            was obtained. Prior Anticoagulants: The patient                            last took aspirin 3 days prior to the procedure.                            ASA Grade Assessment: III - A patient with severe  systemic disease. After reviewing the risks and                            benefits, the patient was deemed in satisfactory                            condition to undergo the procedure.                           After obtaining informed consent, the colonoscope                            was passed under direct vision. Throughout the                            procedure,  the patient's blood pressure, pulse, and                            oxygen saturations were monitored continuously. The                            [id number] scope was introduced through                            the anus and advanced to the the cecum, identified                            by appendiceal orifice and ileocecal valve. The                            colonoscopy was performed without difficulty. The                            patient tolerated the procedure well. The quality                            of the bowel preparation was good. The ileocecal                            valve, appendiceal orifice, and rectum were                            photographed. Scope In: 10:34:41 AM Scope Out: 11:04:34 AM Scope Withdrawal Time: 0 hours 21 minutes 40 seconds  Total Procedure Duration: 0 hours 29 minutes 53 seconds  Findings:      There was a small lipoma, 6 to 10 mm in diameter, in the transverse       colon and at the hepatic flexure.      Six sessile polyps were found in the sigmoid colon and transverse colon.       The polyps were 4 to 6 mm in size. These polyps were removed with a cold       snare. Resection and retrieval were complete. The pathology specimen was       placed into Bottle Number 1.  External hemorrhoids were found during retroflexion. The hemorrhoids       were small. Impression:               - Small lipoma in the transverse colon and at the                            hepatic flexure.                           - Six 4 to 6 mm polyps in the sigmoid colon and in                            the transverse colon, removed with a cold snare.                            Resected and retrieved.                           - External hemorrhoids. Moderate Sedation:      Per Anesthesia Care Recommendation:           - Patient has a contact number available for                            emergencies. The signs and symptoms of potential                             delayed complications were discussed with the                            patient. Return to normal activities tomorrow.                            Written discharge instructions were provided to the                            patient.                           - Resume previous diet today.                           - Continue present medications.                           - Resume aspirin at prior dose tomorrow.                           - Await pathology results.                           - Repeat colonoscopy for surveillance based on                            pathology results. Procedure Code(s):        --- Professional ---  45385, Colonoscopy, flexible; with removal of                            tumor(s), polyp(s), or other lesion(s) by snare                            technique Diagnosis Code(s):        --- Professional ---                           Z86.010, Personal history of colonic polyps                           D17.5, Benign lipomatous neoplasm of                            intra-abdominal organs                           D12.5, Benign neoplasm of sigmoid colon                           D12.3, Benign neoplasm of transverse colon (hepatic                            flexure or splenic flexure)                           K64.4, Residual hemorrhoidal skin tags CPT copyright 2016 American Medical Association. All rights reserved. The codes documented in this report are preliminary and upon coder review may  be revised to meet current compliance requirements. Hildred Laser, MD Hildred Laser, MD 02/14/2016 11:13:04 AM This report has been signed electronically. Number of Addenda: 0

## 2016-02-14 NOTE — Transfer of Care (Signed)
Immediate Anesthesia Transfer of Care Note  Patient: Stephanie Khan  Procedure(s) Performed: Procedure(s) with comments: COLONOSCOPY WITH PROPOFOL (N/A) - 11:25-moved to 10:25 Ann to notify pt to arrive at 9:00am POLYPECTOMY - cold snare multiple polyps  Patient Location: PACU  Anesthesia Type:MAC  Level of Consciousness: awake, alert , oriented and patient cooperative  Airway & Oxygen Therapy: Patient Spontanous Breathing and Patient connected to face mask oxygen  Post-op Assessment: Report given to RN, Post -op Vital signs reviewed and stable and Patient moving all extremities  Post vital signs: Reviewed and stable  Last Vitals:  Filed Vitals:   02/14/16 0901  Temp: 36.4 C    Last Pain:  Filed Vitals:   02/14/16 1027  PainSc: 4       Patients Stated Pain Goal: 6 ([phone]  Complications: No apparent anesthesia complications

## 2016-02-14 NOTE — Discharge Instructions (Signed)
Resume aspirin on 02/15/2016. Resume other medications and diet as before. No driving for 24 hours. Physician will call with biopsy results.  Colonoscopy, Care After Refer to this sheet in the next few weeks. These instructions provide you with information on caring for yourself after your procedure. Your health care provider may also give you more specific instructions. Your treatment has been planned according to current medical practices, but problems sometimes occur. Call your health care provider if you have any problems or questions after your procedure. WHAT TO EXPECT AFTER THE PROCEDURE  After your procedure, it is typical to have the following:  A small amount of blood in your stool.  Moderate amounts of gas and mild abdominal cramping or bloating. HOME CARE INSTRUCTIONS  Do not drive, operate machinery, or sign important documents for 24 hours.  You may shower and resume your regular physical activities, but move at a slower pace for the first 24 hours.  Take frequent rest periods for the first 24 hours.  Walk around or put a warm pack on your abdomen to help reduce abdominal cramping and bloating.  Drink enough fluids to keep your urine clear or pale yellow.  You may resume your normal diet as instructed by your health care provider. Avoid heavy or fried foods that are hard to digest.  Avoid drinking alcohol for 24 hours or as instructed by your health care provider.  Only take over-the-counter or prescription medicines as directed by your health care provider.  If a tissue sample (biopsy) was taken during your procedure:  Do not take aspirin or blood thinners for 7 days, or as instructed by your health care provider.  Do not drink alcohol for 7 days, or as instructed by your health care provider.  Eat soft foods for the first 24 hours. SEEK MEDICAL CARE IF: You have persistent spotting of blood in your stool 2-3 days after the procedure. SEEK IMMEDIATE MEDICAL CARE  IF:  You have more than a small spotting of blood in your stool.  You pass large blood clots in your stool.  Your abdomen is swollen (distended).  You have nausea or vomiting.  You have a fever.  You have increasing abdominal pain that is not relieved with medicine.   This information is not intended to replace advice given to you by your health care provider. Make sure you discuss any questions you have with your health care provider.   Document Released: 04/21/2004 Document Revised: 06/28/2013 Document Reviewed: 05/15/2013 Elsevier Interactive Patient Education 2016 [employer]  PATIENT INSTRUCTIONS POST-ANESTHESIA  IMMEDIATELY FOLLOWING SURGERY:  Do not drive or operate machinery for the first twenty four hours after surgery.  Do not make any important decisions for twenty four hours after surgery or while taking narcotic pain medications or sedatives.  If you develop intractable nausea and vomiting or a severe headache please notify your doctor immediately.  FOLLOW-UP:  Please make an appointment with your surgeon as instructed. You do not need to follow up with anesthesia unless specifically instructed to do so.  WOUND CARE INSTRUCTIONS (if applicable):  Keep a dry clean dressing on the anesthesia/puncture wound site if there is drainage.  Once the wound has quit draining you may leave it open to air.  Generally you should leave the bandage intact for twenty four hours unless there is drainage.  If the epidural site drains for more than 36-48 hours please call the anesthesia department.  QUESTIONS?:  Please feel free to call your physician or  the hospital operator if you have any questions, and they will be happy to assist you.

## 2016-02-14 NOTE — Anesthesia Postprocedure Evaluation (Signed)
Anesthesia Post Note  Patient: Stephanie Khan  Procedure(s) Performed: Procedure(s) (LRB): COLONOSCOPY WITH PROPOFOL (N/A) POLYPECTOMY  Patient location during evaluation: PACU Anesthesia Type: MAC Level of consciousness: awake and alert, oriented and patient cooperative Pain management: pain level controlled Vital Signs Assessment: post-procedure vital signs reviewed and stable Respiratory status: spontaneous breathing, nonlabored ventilation and respiratory function stable Cardiovascular status: blood pressure returned to baseline Postop Assessment: no signs of nausea or vomiting Anesthetic complications: no    Last Vitals:  Filed Vitals:   02/14/16 0901  Temp: 36.4 C    Last Pain:  Filed Vitals:   02/14/16 1117  PainSc: 4                  Yohannes Waibel J

## 2016-02-21 ENCOUNTER — Encounter (HOSPITAL_COMMUNITY): Payer: Self-pay | Admitting: Internal Medicine

## 2017-04-26 ENCOUNTER — Other Ambulatory Visit (HOSPITAL_COMMUNITY)
Admission: RE | Admit: 2017-04-26 | Discharge: 2017-04-26 | Disposition: A | Discharge status: Home / Self Care | Payer: BLUE CROSS/BLUE SHIELD | Source: Ambulatory Visit | Attending: Obstetrics & Gynecology | Admitting: Obstetrics & Gynecology

## 2017-04-26 ENCOUNTER — Ambulatory Visit (INDEPENDENT_AMBULATORY_CARE_PROVIDER_SITE_OTHER): Payer: BLUE CROSS/BLUE SHIELD | Admitting: Obstetrics & Gynecology

## 2017-04-26 ENCOUNTER — Encounter: Payer: Self-pay | Admitting: Obstetrics & Gynecology

## 2017-04-26 VITALS — BP 108/58 | HR 80 | Ht 64.0 in | Wt 192.0 lb

## 2017-04-26 DIAGNOSIS — Z01419 Encounter for gynecological examination (general) (routine) without abnormal findings: Secondary | ICD-10-CM

## 2017-04-26 DIAGNOSIS — Z1212 Encounter for screening for malignant neoplasm of rectum: Secondary | ICD-10-CM | POA: Diagnosis not present

## 2017-04-26 LAB — HEMOCCULT GUIAC POC 1CARD (OFFICE): Fecal Occult Blood, POC: NEGATIVE

## 2017-04-26 NOTE — Progress Notes (Signed)
Subjective:     Stephanie Khan is a 56 y.o. female here for a routine exam.  No LMP recorded. Patient is postmenopausal. [id number] Birth Control Method:  Post menopausal Menstrual Calendar(currently): amenorrheic  Current complaints: some pressure with BMs, has history of chronic constipation.   Current acute medical issues:  diabetic   Recent Gynecologic History No LMP recorded. Patient is postmenopausal. Last Pap: 11/2015,  normal Last mammogram: 2 years ago and normal,    Past Medical History:  Diagnosis Date  . Asthma   . COPD (chronic obstructive pulmonary disease) ([place])   . Depression   . Diabetes mellitus   . Restless leg   . S/P colonoscopy    Dr. Posey Pronto in [place] 2 years ago    Past Surgical History:  Procedure Laterality Date  . back operation     L4-L5  . CHOLECYSTECTOMY    . COLONOSCOPY WITH PROPOFOL N/A 02/14/2016   Procedure: COLONOSCOPY WITH PROPOFOL;  Surgeon: Rogene Houston, MD;  Location: [facility];  Service: Endoscopy;  Laterality: N/A;  11:25-moved to 10:25 Ann to notify pt to arrive at 9:00am  . POLYPECTOMY  02/14/2016   Procedure: POLYPECTOMY;  Surgeon: Rogene Houston, MD;  Location: [facility];  Service: Endoscopy;;  cold snare multiple polyps  . TUBAL LIGATION      OB History    Gravida Para Term Preterm AB Living   2 2 2  0 0 2   SAB TAB Ectopic Multiple Live Births                  Social History   Social History  . Marital status: Married    Spouse name: N/A  . Number of children: N/A  . Years of education: N/A   Occupational History  . [employer]    CNA    Social History Main Topics  . Smoking status: Current Every Day Smoker    Packs/day: 1.00    Years: 35.00    Types: Cigarettes  . Smokeless tobacco: Never Used  . Alcohol use Yes     Comment: rare  . Drug use: No  . Sexual activity: Not Currently    Birth control/ protection: Post-menopausal   Other Topics Concern  . None   Social  History Narrative  . None    Family History  Problem Relation Age of Onset  . Diabetes Mother   . Diabetes Maternal Aunt   . Colon cancer Neg Hx      Current Outpatient Prescriptions:  .  aspirin EC 81 MG tablet, Take 1 tablet (81 mg total) by mouth daily., Disp: , Rfl:  .  clonazePAM (KLONOPIN) 0.5 MG tablet, , Disp: , Rfl: 0 .  insulin glargine (LANTUS) 100 UNIT/ML injection, Inject 22 Units into the skin at bedtime., Disp: , Rfl:  .  metFORMIN (GLUCOPHAGE) 1000 MG tablet, Take 1,000 mg by mouth 2 (two) times daily with a meal.  , Disp: , Rfl:  .  oxyCODONE-acetaminophen (PERCOCET) 7.5-325 MG tablet, Take 1 tablet by mouth 2 (two) times daily as needed for moderate pain. , Disp: , Rfl: 0 .  methocarbamol (ROBAXIN) 500 MG tablet, , Disp: , Rfl: 1 .  polyethylene glycol-electrolytes (NULYTELY/GOLYTELY) 420 g solution, Take 4,000 mLs by mouth once. (Patient not taking: Reported on 04/26/2017), Disp: 4000 mL, Rfl: 0 .  pregabalin (LYRICA) 50 MG capsule, Take 50 mg by mouth daily., Disp: , Rfl:   Review of Systems  Review of Systems  Constitutional: Negative for fever, chills, weight loss, malaise/fatigue and diaphoresis.  HENT: Negative for hearing loss, ear pain, nosebleeds, congestion, sore throat, neck pain, tinnitus and ear discharge.   Eyes: Negative for blurred vision, double vision, photophobia, pain, discharge and redness.  Respiratory: Negative for cough, hemoptysis, sputum production, shortness of breath, wheezing and stridor.   Cardiovascular: Negative for chest pain, palpitations, orthopnea, claudication, leg swelling and PND.  Gastrointestinal: negative for abdominal pain. Negative for heartburn, nausea, vomiting, diarrhea, constipation, blood in stool and melena.  Genitourinary: Negative for dysuria, urgency, frequency, hematuria and flank pain.  Musculoskeletal: Negative for myalgias, back pain, joint pain and falls.  Skin: Negative for itching and rash.  Neurological:  Negative for dizziness, tingling, tremors, sensory change, speech change, focal weakness, seizures, loss of consciousness, weakness and headaches.  Endo/Heme/Allergies: Negative for environmental allergies and polydipsia. Does not bruise/bleed easily.  Psychiatric/Behavioral: Negative for depression, suicidal ideas, hallucinations, memory loss and substance abuse. The patient is not nervous/anxious and does not have insomnia.        Objective:  Blood pressure (!) 108/58, pulse 80, height 5\' 4"  (1.626 m), weight 192 lb (87.1 kg).   Physical Exam  Vitals reviewed. Constitutional: She is oriented to person, place, and time. She appears well-developed and well-nourished.  HENT:  Head: Normocephalic and atraumatic.        Right Ear: External ear normal.  Left Ear: External ear normal.  Nose: Nose normal.  Mouth/Throat: Oropharynx is clear and moist.  Eyes: Conjunctivae and EOM are normal. Pupils are equal, round, and reactive to light. Right eye exhibits no discharge. Left eye exhibits no discharge. No scleral icterus.  Neck: Normal range of motion. Neck supple. No tracheal deviation present. No thyromegaly present.  Cardiovascular: Normal rate, regular rhythm, normal heart sounds and intact distal pulses.  Exam reveals no gallop and no friction rub.   No murmur heard. Respiratory: Effort normal and breath sounds normal. No respiratory distress. She has no wheezes. She has no rales. She exhibits no tenderness.  GI: Soft. Bowel sounds are normal. She exhibits no distension and no mass. There is no tenderness. There is no rebound and no guarding.  Genitourinary:  Breasts no masses skin changes or nipple changes bilaterally      Vulva is normal without lesions Vagina is pink moist without discharge Cervix normal in appearance and pap is done Uterus is normal size shape and contour Adnexa is negative with normal sized ovaries  {Rectal    hemoccult negative, normal tone, no masses   Musculoskeletal: Normal range of motion. She exhibits no edema and no tenderness.  Neurological: She is alert and oriented to person, place, and time. She has normal reflexes. She displays normal reflexes. No cranial nerve deficit. She exhibits normal muscle tone. Coordination normal.  Skin: Skin is warm and dry. No rash noted. No erythema. No pallor.  Psychiatric: She has a normal mood and affect. Her behavior is normal. Judgment and thought content normal.       Medications Ordered at today's visit: Meds ordered this encounter  Medications  . clonazePAM (KLONOPIN) 0.5 MG tablet    Refill:  0  . methocarbamol (ROBAXIN) 500 MG tablet    Refill:  1    Other orders placed at today's visit: Orders Placed This Encounter  Procedures  . POCT occult blood stool      Assessment:    Healthy female exam.    Plan:    Mammogram ordered. Follow  up in: 2 years.     Return in about 2 years (around 04/27/2019) for yearly, with Dr Elonda Husky.

## 2017-04-29 LAB — CYTOLOGY - PAP
Adequacy: ABSENT
Diagnosis: NEGATIVE
HPV: NOT DETECTED

## 2018-04-13 ENCOUNTER — Encounter (HOSPITAL_COMMUNITY): Payer: Self-pay | Admitting: Emergency Medicine

## 2018-04-13 ENCOUNTER — Emergency Department (HOSPITAL_COMMUNITY): Payer: BLUE CROSS/BLUE SHIELD

## 2018-04-13 ENCOUNTER — Other Ambulatory Visit: Payer: Self-pay

## 2018-04-13 ENCOUNTER — Emergency Department (HOSPITAL_COMMUNITY)
Admission: EM | Admit: 2018-04-13 | Discharge: 2018-04-13 | Disposition: A | Discharge status: Home / Self Care | Payer: BLUE CROSS/BLUE SHIELD | Attending: Emergency Medicine | Admitting: Emergency Medicine

## 2018-04-13 ENCOUNTER — Telehealth: Payer: Self-pay | Admitting: Orthopedic Surgery

## 2018-04-13 DIAGNOSIS — Y9301 Activity, walking, marching and hiking: Secondary | ICD-10-CM | POA: Insufficient documentation

## 2018-04-13 DIAGNOSIS — S42352A Displaced comminuted fracture of shaft of humerus, left arm, initial encounter for closed fracture: Secondary | ICD-10-CM | POA: Diagnosis not present

## 2018-04-13 DIAGNOSIS — W01198A Fall on same level from slipping, tripping and stumbling with subsequent striking against other object, initial encounter: Secondary | ICD-10-CM | POA: Diagnosis not present

## 2018-04-13 DIAGNOSIS — J449 Chronic obstructive pulmonary disease, unspecified: Secondary | ICD-10-CM | POA: Diagnosis not present

## 2018-04-13 DIAGNOSIS — Z794 Long term (current) use of insulin: Secondary | ICD-10-CM | POA: Insufficient documentation

## 2018-04-13 DIAGNOSIS — E119 Type 2 diabetes mellitus without complications: Secondary | ICD-10-CM | POA: Insufficient documentation

## 2018-04-13 DIAGNOSIS — S4992XA Unspecified injury of left shoulder and upper arm, initial encounter: Secondary | ICD-10-CM | POA: Diagnosis present

## 2018-04-13 DIAGNOSIS — F17210 Nicotine dependence, cigarettes, uncomplicated: Secondary | ICD-10-CM | POA: Diagnosis not present

## 2018-04-13 DIAGNOSIS — Z7982 Long term (current) use of aspirin: Secondary | ICD-10-CM | POA: Diagnosis not present

## 2018-04-13 DIAGNOSIS — Y999 Unspecified external cause status: Secondary | ICD-10-CM | POA: Diagnosis not present

## 2018-04-13 DIAGNOSIS — Z79899 Other long term (current) drug therapy: Secondary | ICD-10-CM | POA: Diagnosis not present

## 2018-04-13 DIAGNOSIS — Y929 Unspecified place or not applicable: Secondary | ICD-10-CM | POA: Insufficient documentation

## 2018-04-13 MED ORDER — OXYCODONE HCL 5 MG PO TABS: 5.0000 mg | ORAL_TABLET | Freq: Four times a day (QID) | ORAL | 0 refills | Status: DC | PRN

## 2018-04-13 MED ORDER — ACETAMINOPHEN 500 MG PO TABS: 500.0000 mg | ORAL_TABLET | Freq: Four times a day (QID) | ORAL | 0 refills | Status: DC | PRN

## 2018-04-13 MED ORDER — HYDROMORPHONE HCL 1 MG/ML IJ SOLN
1.0000 mg | Freq: Once | INTRAMUSCULAR | Status: AC
  Administered 2018-04-13: 1 mg via INTRAMUSCULAR
  Filled 2018-04-13: qty 1

## 2018-04-13 MED ORDER — IBUPROFEN 600 MG PO TABS: 600.0000 mg | ORAL_TABLET | Freq: Four times a day (QID) | ORAL | 0 refills | Status: DC | PRN

## 2018-04-13 MED ORDER — FENTANYL CITRATE (PF) 100 MCG/2ML IJ SOLN
100.0000 ug | Freq: Once | INTRAMUSCULAR | Status: AC
  Administered 2018-04-13: 100 ug via INTRAMUSCULAR
  Filled 2018-04-13: qty 2

## 2018-04-13 NOTE — Telephone Encounter (Signed)
Patient called this afternoon.  She went to ER for Comminuted, angulated and displaced proximal to mid left humeral fracture.  Please review her chart and x-rays and advise as to when or where to schedule her   Thank you

## 2018-04-13 NOTE — ED Triage Notes (Signed)
Pt states tripped and fell over a railroad cross tie in yard. Hurting to left humerus.

## 2018-04-13 NOTE — ED Provider Notes (Signed)
[facility] EMERGENCY DEPARTMENT Provider Note   CSN: [id number] Arrival date & time: 04/13/18  2119     History   Chief Complaint Chief Complaint  Patient presents with  . Arm Pain    HPI Stephanie Khan is a 57 y.o. female with history of asthma, COPD, hard of hearing, diabetes mellitus presents for evaluation of acute onset, constant throbbing pain to the left shoulder after injury this morning.  Patient states that she was picking up trash when she tripped and her left shoulder and upper arm struck a railroad cross tie.  She denies head injury or loss of consciousness.  No prodrome leading up to the fall.  She denies headache, neck pain.  She endorses constant throbbing pain to the left shoulder radiating to the left upper arm.  She states she has not been able to move the shoulder since the fall but is able to wiggle her fingers.  Pain worsens with any movement of the left shoulder.  No medications prior to arrival.  The history is provided by the patient.    Past Medical History:  Diagnosis Date  . Asthma   . COPD (chronic obstructive pulmonary disease) ([place])   . Depression   . Diabetes mellitus   . Restless leg   . S/P colonoscopy    Dr. Posey Pronto in [place] 2 years ago    Patient Active Problem List   Diagnosis Date Noted  . Constipation 04/16/2011  . Abdominal pain 04/16/2011    Past Surgical History:  Procedure Laterality Date  . back operation     L4-L5  . CHOLECYSTECTOMY    . COLONOSCOPY WITH PROPOFOL N/A 02/14/2016   Procedure: COLONOSCOPY WITH PROPOFOL;  Surgeon: Rogene Houston, MD;  Location: [facility] ENDO SUITE;  Service: Endoscopy;  Laterality: N/A;  11:25-moved to 10:25 Ann to notify pt to arrive at 9:00am  . POLYPECTOMY  02/14/2016   Procedure: POLYPECTOMY;  Surgeon: Rogene Houston, MD;  Location: [facility] ENDO SUITE;  Service: Endoscopy;;  cold snare multiple polyps  . TUBAL LIGATION       OB History    Gravida  2   Para  2   Term  2   Preterm  0   AB    0   Living  2     SAB      TAB      Ectopic      Multiple      Live Births               Home Medications    Prior to Admission medications   Medication Sig Start Date End Date Taking? Authorizing Provider  acidophilus (RISAQUAD) CAPS capsule Take 1 capsule by mouth daily.   Yes [provider]  aspirin EC 81 MG tablet Take 1 tablet (81 mg total) by mouth daily. 02/15/16  Yes Rehman, Mechele Dawley, MD  CHANTIX 1 MG tablet Take 1 tablet by mouth 2 (two) times daily. 03/22/18  Yes [provider]  clonazePAM (KLONOPIN) 0.5 MG tablet Take 0.5 mg by mouth daily as needed.  04/01/17  Yes [provider]  insulin glargine (LANTUS) 100 UNIT/ML injection Inject 22 Units into the skin at bedtime.   Yes [provider]  loratadine (CLARITIN) 10 MG tablet Take 1 tablet by mouth daily. 03/22/18  Yes [provider]  metFORMIN (GLUCOPHAGE) 1000 MG tablet Take 1,000 mg by mouth 2 (two) times daily with a meal.     Yes  [provider]  methocarbamol (ROBAXIN) 500 MG tablet Take 500 mg by mouth daily as needed.  04/01/17  Yes [provider]  oxyCODONE-acetaminophen (PERCOCET) 10-325 MG tablet Take 1 tablet by mouth 2 (two) times daily as needed for moderate pain.  12/28/15  Yes [provider]  pregabalin (LYRICA) 50 MG capsule Take 50 mg by mouth daily as needed.    Yes [provider]  acetaminophen (TYLENOL) 500 MG tablet Take 1 tablet (500 mg total) by mouth every 6 (six) hours as needed. 04/13/18   Arnesia Vincelette A, PA-C  ibuprofen (ADVIL,MOTRIN) 600 MG tablet Take 1 tablet (600 mg total) by mouth every 6 (six) hours as needed. 04/13/18   Mekia Dipinto A, PA-C  oxyCODONE (ROXICODONE) 5 MG immediate release tablet Take 1 tablet (5 mg total) by mouth every 6 (six) hours as needed for severe pain. 04/13/18   Renita Papa, PA-C    Family History Family History  Problem Relation Age of Onset  . Diabetes Mother   . Diabetes  Maternal Aunt   . Colon cancer Neg Hx     Social History Social History   Tobacco Use  . Smoking status: Current Every Day Smoker    Packs/day: 1.00    Years: 35.00    Pack years: 35.00    Types: Cigarettes  . Smokeless tobacco: Never Used  Substance Use Topics  . Alcohol use: Yes    Comment: rare  . Drug use: No     Allergies   Patient has no known allergies.   Review of Systems Review of Systems  Musculoskeletal: Positive for arthralgias. Negative for neck pain.  Neurological: Positive for numbness. Negative for syncope, weakness, light-headedness and headaches.  All other systems reviewed and are negative.    Physical Exam Updated Vital Signs BP 101/65 (BP Location: Right Arm)   Pulse 84   Temp 97.8 F (36.6 C) (Oral)   Resp 18   Ht 5\' 4"  (1.626 m)   Wt 93.4 kg (206 lb)   SpO2 98%   BMI 35.36 kg/m   Physical Exam  Constitutional: She appears well-developed and well-nourished. No distress.  HENT:  Head: Normocephalic and atraumatic.  No Battle's signs, no raccoon's eyes, no rhinorrhea. No hemotympanum. No tenderness to palpation of the face or skull. No deformity, crepitus, or swelling noted.   Eyes: Pupils are equal, round, and reactive to light. Conjunctivae and EOM are normal. Right eye exhibits no discharge. Left eye exhibits no discharge.  Neck: Normal range of motion. Neck supple. No JVD present. No tracheal deviation present.  Cardiovascular: Normal rate and intact distal pulses.  2+ radial pulses bilaterally  Pulmonary/Chest: Effort normal.  Abdominal: She exhibits no distension.  Musculoskeletal: She exhibits no edema.  No midline spine tenderness, no paracervical muscle tenderness.  There is tenderness to palpation of the left shoulder and left upper arm posteriorly.  Patient is unable to range the left shoulder, limited flexion of the left elbow secondary to shoulder pain.  Good grip strength bilaterally.  Neurological: She is alert.  Fluent  speech with no evidence of dysarthria or aphasia, no facial droop, sensation intact to soft touch of bilateral upper extremities  Skin: Skin is warm and dry. No erythema.  Psychiatric: She has a normal mood and affect. Her behavior is normal.  Nursing note and vitals reviewed.    ED Treatments / Results  Labs (all labs ordered are listed, but only abnormal results are displayed) Labs Reviewed -  No data to display  EKG None  Radiology Dg Shoulder Left  Result Date: 04/13/2018 CLINICAL DATA:  [place] and landed on left arm.  Severe pain. EXAM: LEFT SHOULDER - 2+ VIEW COMPARISON:  None. FINDINGS: Comminuted and displaced fracture involving the proximal and mid left humerus with a large butterfly fragment. Left shoulder is located. Densities along the superolateral aspect of the humeral head are compatible with calcific tendinitis. Normal alignment of the left AC joint. IMPRESSION: Comminuted and displaced fracture involving the left humerus. Left shoulder is located. Electronically Signed   By: Markus Daft M.D.   On: 04/13/2018 10:37   Dg Humerus Left  Result Date: 04/13/2018 CLINICAL DATA:  Fall, landing on left arm.  Pain. EXAM: LEFT HUMERUS - 2+ VIEW COMPARISON:  None. FINDINGS: Comminuted displaced and angulated fracture noted through the proximal to mid left humerus. Glenohumeral joint appears intact. Soft tissues are intact. IMPRESSION: Comminuted, angulated and displaced proximal to mid left humeral fracture. Electronically Signed   By: Rolm Baptise M.D.   On: 04/13/2018 10:35    Procedures Procedures (including critical care time)  Medications Ordered in ED Medications  fentaNYL (SUBLIMAZE) injection 100 mcg (100 mcg Intramuscular Given 04/13/18 1018)  HYDROmorphone (DILAUDID) injection 1 mg (1 mg Intramuscular Given 04/13/18 1112)     Initial Impression / Assessment and Plan / ED Course  I have reviewed the triage vital signs and the nursing notes.  Pertinent labs & imaging  results that were available during my care of the patient were reviewed by me and considered in my medical decision making (see chart for details).     Patient presents with left shoulder and left upper arm pain after mechanical fall landing on a railroad tie.  She is afebrile, vital signs are stable.  She is nontoxic in appearance.  She is unable to range the left shoulder.  She is neurovascularly intact.  Radiographs show acute comminuted, displaced, and angulated proximal to mid humerus fracture.  Spoke with Dr. Aline Brochure with orthopedic surgery who recommends splint, sling, and follow-up with him in the office.  Patient neurovascularly intact after splint application.  Recommend Tylenol, NSAIDs, ice, will give a small amount of oxycodone for severe breakthrough pain.  Recommend follow-up with Dr. Aline Brochure.  Discussed strict ED return precautions.  Patient and patient's significant other verbalized understanding of and agreement with plan and patient stable for discharge home at this time.  Final Clinical Impressions(s) / ED Diagnoses   Final diagnoses:  Closed displaced comminuted fracture of shaft of left humerus, initial encounter    ED Discharge Orders        Ordered    acetaminophen (TYLENOL) 500 MG tablet  Every 6 hours PRN     04/13/18 1208    ibuprofen (ADVIL,MOTRIN) 600 MG tablet  Every 6 hours PRN     04/13/18 1208    oxyCODONE (ROXICODONE) 5 MG immediate release tablet  Every 6 hours PRN     04/13/18 1208       Renita Papa, PA-C 04/15/18 1001    Julianne Rice, MD 04/16/18 [phone]

## 2018-04-13 NOTE — Discharge Instructions (Addendum)
Alternate 600 mg of ibuprofen and [phone] mg of Tylenol every 3 hours as needed for pain. Do not exceed 4000 mg of Tylenol daily.  Take ibuprofen with food to avoid upset stomach issues.  Take oxycodone as needed for severe pain but do not drive, drink alcohol, or operate heavy machinery while taking this medicine as it may make you drowsy.  Keep the splint clean and dry.  Cover it when you are showering with a bag.  Follow-up with Dr. Aline Brochure with orthopedics for reevaluation.  Return to the emergency department if any concerning signs or symptoms develop such as swelling, loss of pulses, fevers, or redness.

## 2018-04-14 NOTE — Telephone Encounter (Signed)
Needs appt in 2 weeks for fracture cuff   Amy: check closet to make sure we have all sizes of fracture cuff

## 2018-04-14 NOTE — Telephone Encounter (Signed)
Yes, we have fracture cuffs To Angie to schedule appointment. Please

## 2018-04-14 NOTE — Telephone Encounter (Signed)
ok 

## 2018-04-29 ENCOUNTER — Encounter: Payer: Self-pay | Admitting: Orthopedic Surgery

## 2018-04-29 ENCOUNTER — Ambulatory Visit (INDEPENDENT_AMBULATORY_CARE_PROVIDER_SITE_OTHER): Payer: BLUE CROSS/BLUE SHIELD

## 2018-04-29 ENCOUNTER — Ambulatory Visit: Payer: BLUE CROSS/BLUE SHIELD | Admitting: Orthopedic Surgery

## 2018-04-29 VITALS — BP 106/64 | HR 89 | Ht 64.0 in | Wt 205.0 lb

## 2018-04-29 DIAGNOSIS — S42352A Displaced comminuted fracture of shaft of humerus, left arm, initial encounter for closed fracture: Secondary | ICD-10-CM

## 2018-04-29 NOTE — Progress Notes (Signed)
NEW PATIENT OFFICE VISI  Chief Complaint  Patient presents with  . Arm Injury    left humerus fracture / splint very uncomfortable has slid down to elbow     57 years old presents for first visit to our office with a left proximal midshaft humerus fracture  Date of injury July 24  Injury day #16  Pain located left humerus Duration 16 days Quality dull Severity initially severe now mild Associated with swelling   Review of Systems  Constitutional: Negative for chills and fever.  Musculoskeletal: Positive for joint pain.  Neurological: Negative for tingling and focal weakness.  All other systems reviewed and are negative.    Past Medical History:  Diagnosis Date  . Asthma   . COPD (chronic obstructive pulmonary disease) ([place])   . Depression   . Diabetes mellitus   . Restless leg   . S/P colonoscopy    Dr. Posey Pronto in [place] 2 years ago    Past Surgical History:  Procedure Laterality Date  . back operation     L4-L5  . CHOLECYSTECTOMY    . COLONOSCOPY WITH PROPOFOL N/A 02/14/2016   Procedure: COLONOSCOPY WITH PROPOFOL;  Surgeon: Rogene Houston, MD;  Location: [facility];  Service: Endoscopy;  Laterality: N/A;  11:25-moved to 10:25 Ann to notify pt to arrive at 9:00am  . POLYPECTOMY  02/14/2016   Procedure: POLYPECTOMY;  Surgeon: Rogene Houston, MD;  Location: [facility];  Service: Endoscopy;;  cold snare multiple polyps  . TUBAL LIGATION      Family History  Problem Relation Age of Onset  . Diabetes Mother   . Diabetes Maternal Aunt   . Colon cancer Neg Hx    Social History   Tobacco Use  . Smoking status: Current Every Day Smoker    Packs/day: 1.00    Years: 35.00    Pack years: 35.00    Types: Cigarettes  . Smokeless tobacco: Never Used  Substance Use Topics  . Alcohol use: Yes    Comment: rare  . Drug use: No    No Known Allergies  Current Meds  Medication Sig  . acetaminophen (TYLENOL) 500 MG tablet Take 1 tablet (500 mg total) by  mouth every 6 (six) hours as needed.  Marland Kitchen acidophilus (RISAQUAD) CAPS capsule Take 1 capsule by mouth daily.  Marland Kitchen aspirin EC 81 MG tablet Take 1 tablet (81 mg total) by mouth daily.  . CHANTIX 1 MG tablet Take 1 tablet by mouth 2 (two) times daily.  . clonazePAM (KLONOPIN) 0.5 MG tablet Take 0.5 mg by mouth daily as needed.   Marland Kitchen ibuprofen (ADVIL,MOTRIN) 600 MG tablet Take 1 tablet (600 mg total) by mouth every 6 (six) hours as needed.  . insulin glargine (LANTUS) 100 UNIT/ML injection Inject 22 Units into the skin at bedtime.  Marland Kitchen loratadine (CLARITIN) 10 MG tablet Take 1 tablet by mouth daily.  . metFORMIN (GLUCOPHAGE) 1000 MG tablet Take 1,000 mg by mouth 2 (two) times daily with a meal.    . methocarbamol (ROBAXIN) 500 MG tablet Take 500 mg by mouth daily as needed.   Marland Kitchen oxyCODONE (ROXICODONE) 5 MG immediate release tablet Take 1 tablet (5 mg total) by mouth every 6 (six) hours as needed for severe pain.  Marland Kitchen oxyCODONE-acetaminophen (PERCOCET) 10-325 MG tablet Take 1 tablet by mouth 2 (two) times daily as needed for moderate pain.   . pregabalin (LYRICA) 50 MG capsule Take 50 mg by mouth daily as needed.  BP 106/64   Pulse 89   Ht 5\' 4"  (1.626 m)   Wt 205 lb (93 kg)   BMI 35.19 kg/m   Physical Exam  Constitutional: She is oriented to person, place, and time. She appears well-developed and well-nourished.  Neurological: She is alert and oriented to person, place, and time.  Psychiatric: She has a normal mood and affect. Judgment normal.  Vitals reviewed.   Ortho Exam  Left upper extremity is swollen and tender there is comminution and fracture movement at the fracture site the wrist motion is normal including extension as well as the of the radial nerve functions elbow wrist are stable shoulder not tested muscle tone is normal skin is bruised and ecchymotic pulse and temperature are normal sensation is excellent lymph nodes are negative  MEDICAL DECISION SECTION  Xrays were done  at Hospital  Comminuted midshaft to proximal humerus fracture done on July 27    My independent reading of xrays:  Today the x-ray shows a 12 degree angulation on one view comminuted fracture see report  Encounter Diagnosis  Name Primary?  . Closed displaced comminuted fracture of shaft of left humerus, initial encounter Yes    PLAN: (Rx., injectx, surgery, frx, mri/ct) Fracture cuff x-ray in a couple weeks  No orders of the defined types were placed in this encounter.   Arther Abbott, MD  04/29/2018 11:29 AM

## 2018-05-17 DIAGNOSIS — S42302A Unspecified fracture of shaft of humerus, left arm, initial encounter for closed fracture: Secondary | ICD-10-CM | POA: Insufficient documentation

## 2018-05-18 ENCOUNTER — Encounter: Payer: Self-pay | Admitting: Orthopedic Surgery

## 2018-05-18 ENCOUNTER — Ambulatory Visit (INDEPENDENT_AMBULATORY_CARE_PROVIDER_SITE_OTHER): Payer: BLUE CROSS/BLUE SHIELD

## 2018-05-18 ENCOUNTER — Ambulatory Visit (INDEPENDENT_AMBULATORY_CARE_PROVIDER_SITE_OTHER): Payer: BLUE CROSS/BLUE SHIELD | Admitting: Orthopedic Surgery

## 2018-05-18 VITALS — BP 125/81 | HR 71 | Ht 64.0 in | Wt 205.0 lb

## 2018-05-18 DIAGNOSIS — S42352D Displaced comminuted fracture of shaft of humerus, left arm, subsequent encounter for fracture with routine healing: Secondary | ICD-10-CM

## 2018-05-18 MED ORDER — OXYCODONE-ACETAMINOPHEN 10-325 MG PO TABS: 1.0000 | ORAL_TABLET | ORAL | 0 refills | Status: AC | PRN

## 2018-05-18 NOTE — Progress Notes (Signed)
Fracture care  Chief Complaint  Patient presents with  . Shoulder Pain    04/13/18 left humerus fracture     Day 35 left humerus fracture currently neck fracture cuff  1-1/2 pack/day smoker  Radial nerve function intact  X-rays  Counseling was performed to warn her about smoking and how it affects her fracture as well as her general health  She is on Chantix I asked her to cut back his meds she can as best she can  Follow-up in 1 month for repeat x-ray  She has gotten permission from the pain clinic to increase her pain medicine from 10 mg twice a day to 10 mg every 4 to 6 hours  Meds ordered this encounter  Medications  . oxyCODONE-acetaminophen (PERCOCET) 10-325 MG tablet    Sig: Take 1 tablet by mouth every 4 (four) hours as needed for pain.    Dispense:  42 tablet    Refill:  0

## 2018-06-17 ENCOUNTER — Ambulatory Visit (INDEPENDENT_AMBULATORY_CARE_PROVIDER_SITE_OTHER): Payer: BLUE CROSS/BLUE SHIELD | Admitting: Orthopedic Surgery

## 2018-06-17 ENCOUNTER — Encounter: Payer: Self-pay | Admitting: Orthopedic Surgery

## 2018-06-17 ENCOUNTER — Ambulatory Visit (INDEPENDENT_AMBULATORY_CARE_PROVIDER_SITE_OTHER): Payer: BLUE CROSS/BLUE SHIELD

## 2018-06-17 VITALS — BP 127/78 | HR 78 | Ht 64.0 in | Wt 200.0 lb

## 2018-06-17 DIAGNOSIS — S42352D Displaced comminuted fracture of shaft of humerus, left arm, subsequent encounter for fracture with routine healing: Secondary | ICD-10-CM

## 2018-06-17 NOTE — Progress Notes (Signed)
Chief Complaint  Patient presents with  . Arm Injury    left humerus fracture 04/13/18    Fracture care left humerus treated with fracture brace  This is day 65 since the injury  X-ray shows fracture is healing well minimal angulation at the fracture site  Patient has normal radial nerve function  Recommend continue fracture brace and x-ray in 4 weeks  She can remove the sling  Encounter Diagnosis  Name Primary?  . Closed displaced comminuted fracture of shaft of left humerus with routine healing, subsequent encounter 04/13/18 Yes

## 2018-07-18 ENCOUNTER — Ambulatory Visit (INDEPENDENT_AMBULATORY_CARE_PROVIDER_SITE_OTHER): Payer: BLUE CROSS/BLUE SHIELD

## 2018-07-18 ENCOUNTER — Encounter: Payer: Self-pay | Admitting: Orthopedic Surgery

## 2018-07-18 ENCOUNTER — Ambulatory Visit (INDEPENDENT_AMBULATORY_CARE_PROVIDER_SITE_OTHER): Payer: BLUE CROSS/BLUE SHIELD | Admitting: Orthopedic Surgery

## 2018-07-18 VITALS — BP 120/70 | HR 79 | Ht 64.0 in | Wt 200.0 lb

## 2018-07-18 DIAGNOSIS — S42352D Displaced comminuted fracture of shaft of humerus, left arm, subsequent encounter for fracture with routine healing: Secondary | ICD-10-CM

## 2018-07-18 NOTE — Progress Notes (Signed)
Chief Complaint  Patient presents with  . Fracture    Left humerus 04/13/18    Left mid to proximal comminuted fracture of the humerus.  Radial nerve function intact.  Fracture site nontender.  Shoulder motion internal/external rotation passive range of motion normal  Elbow range of motion normal  X-ray shows normal alignment on the lateral view WITH 35 degrees apex lateral angulation on the [facility] view With minimal callus formation   Recommend continue fracture cuff additional 6 weeks  Repeat x-ray

## 2018-08-23 IMAGING — DX DG SHOULDER 2+V*L*
2 series · 2 of 2 positions shown · non-contrast
Comparison: None.

CLINICAL DATA: Fell and landed on left arm.  Severe pain.

EXAM:
LEFT SHOULDER - 2+ VIEW

[shoulder grashey]
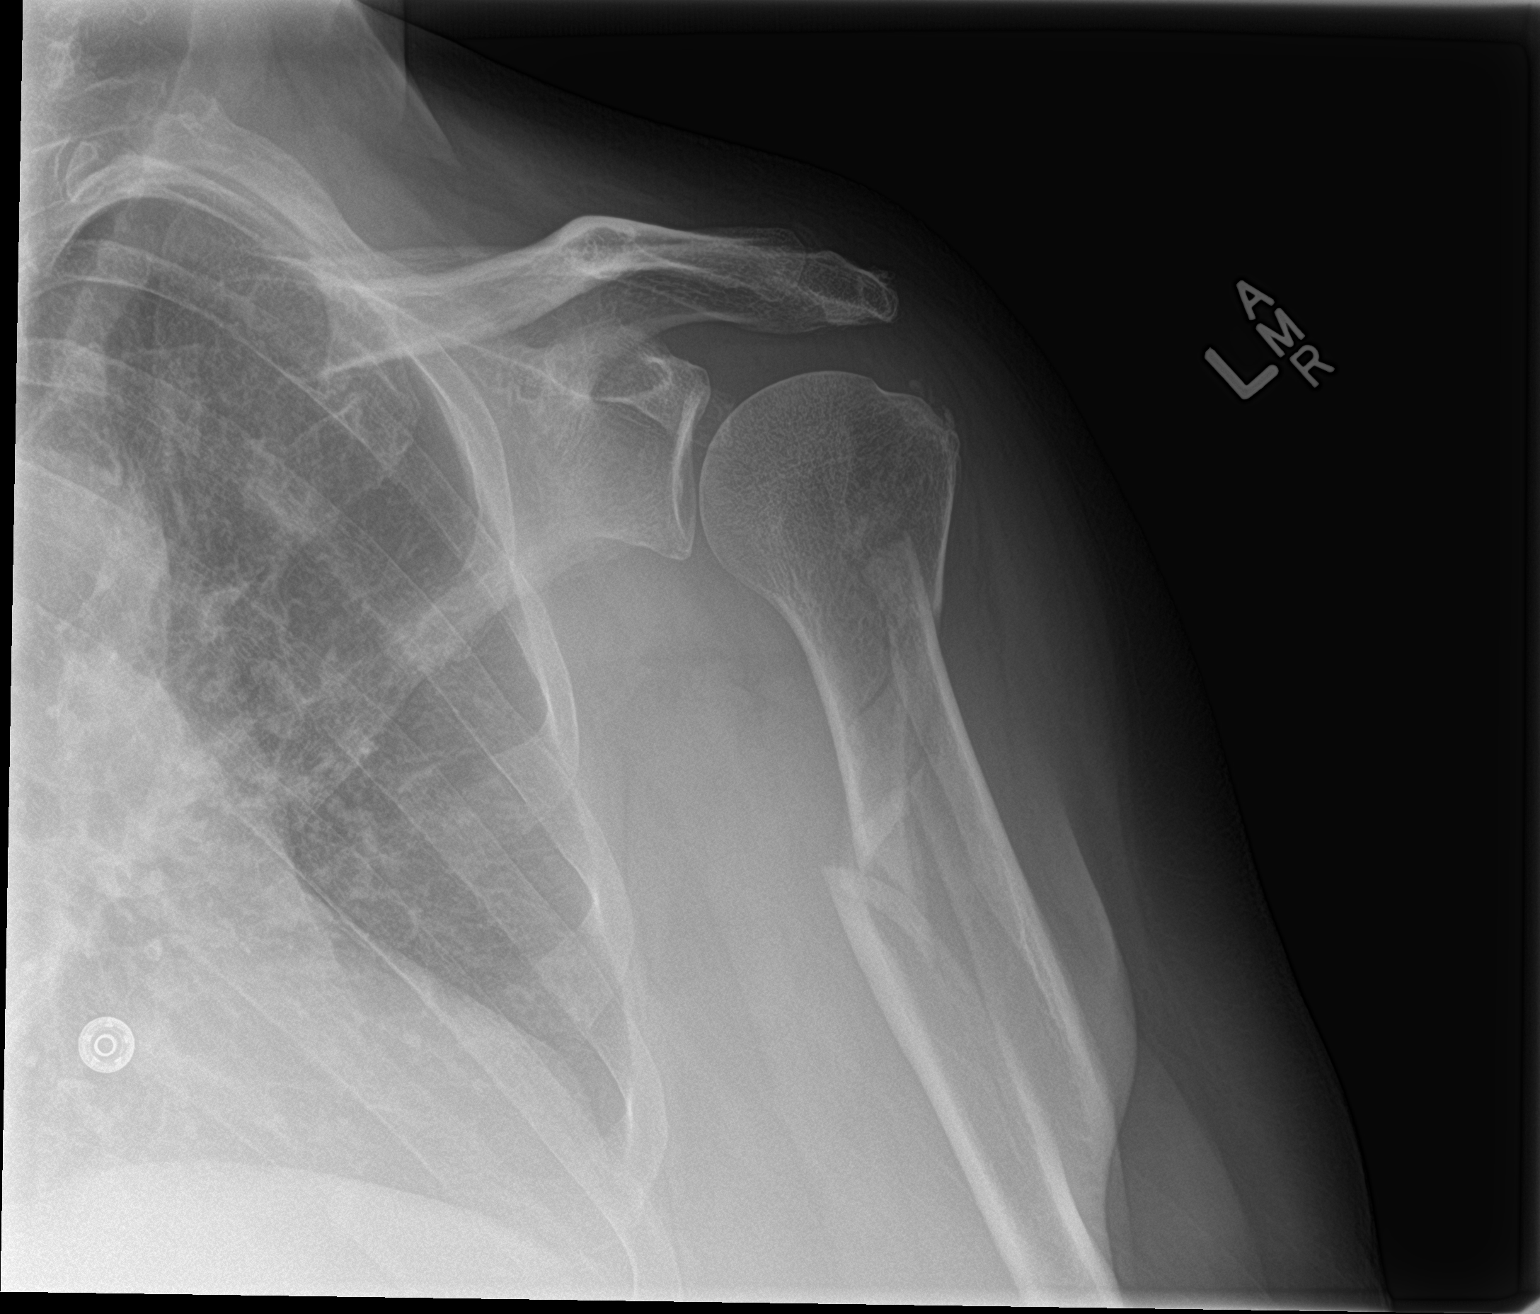

[shoulder y view]
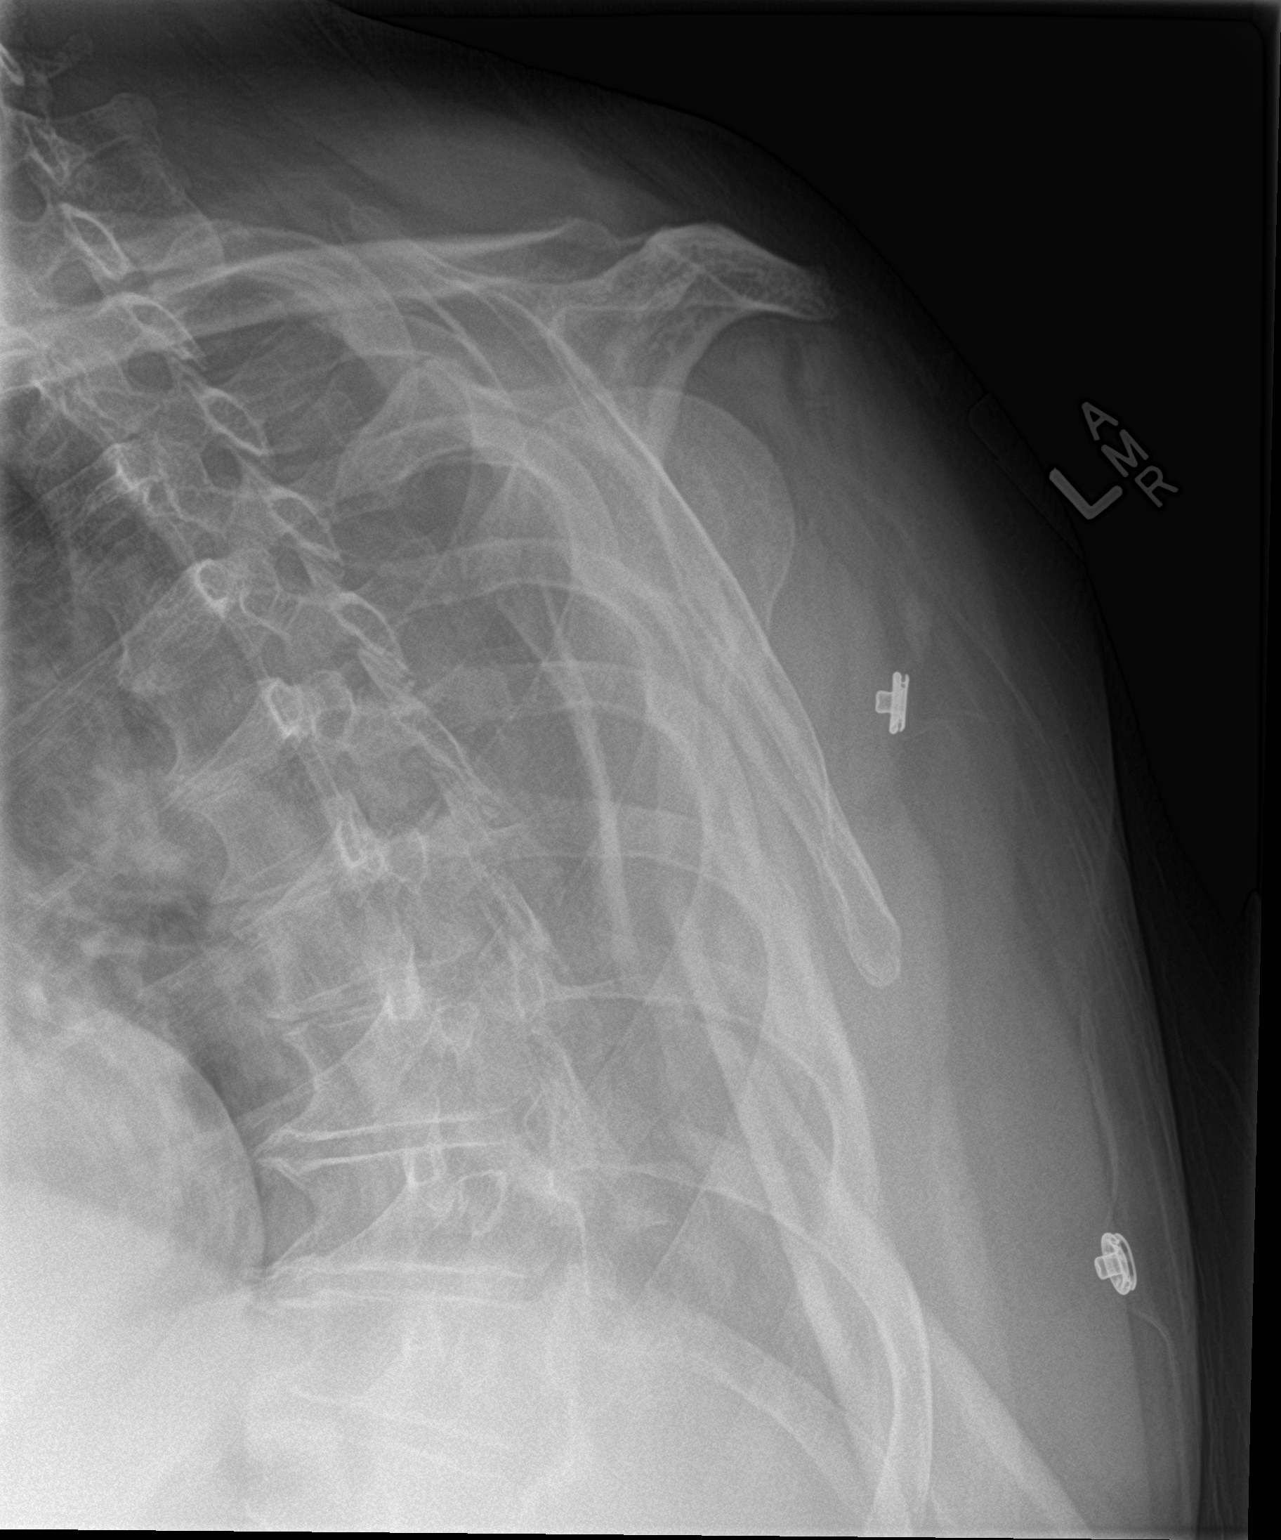

[2 of 2 positions shown; findings below may reference images not displayed]

FINDINGS: Comminuted and displaced fracture involving the proximal and mid
left humerus with a large butterfly fragment. Left shoulder is
located. Densities along the superolateral aspect of the humeral
head are compatible with calcific tendinitis. Normal alignment of
the left AC joint.
IMPRESSION: Comminuted and displaced fracture involving the left humerus.

Left shoulder is located.

## 2018-08-29 ENCOUNTER — Encounter: Payer: Self-pay | Admitting: Orthopedic Surgery

## 2018-08-29 ENCOUNTER — Ambulatory Visit (INDEPENDENT_AMBULATORY_CARE_PROVIDER_SITE_OTHER): Payer: BLUE CROSS/BLUE SHIELD | Admitting: Orthopedic Surgery

## 2018-08-29 ENCOUNTER — Ambulatory Visit (INDEPENDENT_AMBULATORY_CARE_PROVIDER_SITE_OTHER): Payer: BLUE CROSS/BLUE SHIELD

## 2018-08-29 VITALS — BP 116/71 | HR 71 | Ht 64.0 in | Wt 200.0 lb

## 2018-08-29 DIAGNOSIS — S42352D Displaced comminuted fracture of shaft of humerus, left arm, subsequent encounter for fracture with routine healing: Secondary | ICD-10-CM

## 2018-08-29 NOTE — Progress Notes (Signed)
Patient ID: Stephanie Khan, female   DOB: 1961/04/06, 57 y.o.   MRN: [id number]  Routine fu FRACTURE HUMERUS   Chief Complaint  Patient presents with  . Arm Injury    left humerus fracture     Encounter Diagnosis  Name Primary?  . Closed displaced comminuted fracture of shaft of left humerus with routine healing, subsequent encounter 04/13/18 Yes    POST INJURY DAY: [name] 121/90  17 WEEKS   X-ray shows callus primarily on the [facility] x-ray large butterfly fragment.  She has minimal tenderness at the fracture site  Radial nerve is intact  Recommend continue fracture brace x-ray in 8 weeks

## 2018-10-26 ENCOUNTER — Ambulatory Visit: Payer: BLUE CROSS/BLUE SHIELD | Admitting: Orthopedic Surgery

## 2018-10-31 ENCOUNTER — Ambulatory Visit (INDEPENDENT_AMBULATORY_CARE_PROVIDER_SITE_OTHER): Payer: BLUE CROSS/BLUE SHIELD | Admitting: Orthopedic Surgery

## 2018-10-31 ENCOUNTER — Encounter: Payer: Self-pay | Admitting: Orthopedic Surgery

## 2018-10-31 ENCOUNTER — Ambulatory Visit (INDEPENDENT_AMBULATORY_CARE_PROVIDER_SITE_OTHER): Payer: BLUE CROSS/BLUE SHIELD

## 2018-10-31 VITALS — BP 136/82 | HR 84 | Ht 64.0 in | Wt 200.0 lb

## 2018-10-31 DIAGNOSIS — S42352K Displaced comminuted fracture of shaft of humerus, left arm, subsequent encounter for fracture with nonunion: Secondary | ICD-10-CM

## 2018-10-31 DIAGNOSIS — S42352D Displaced comminuted fracture of shaft of humerus, left arm, subsequent encounter for fracture with routine healing: Secondary | ICD-10-CM | POA: Diagnosis not present

## 2018-10-31 NOTE — Patient Instructions (Signed)
Humerus Fracture Treated With ORIF  A humerus fracture is a break in the in the upper arm bone (humerus). If the fracture is displaced, that means that one or more parts of the bone have been moved out of their normal position and are not lined up correctly. Open reduction with internal fixation (ORIF) is a surgical procedure that may be used to treat a humerus fracture that is displaced. In this procedure, a surgeon will move parts of the bone back into the right position. The surgeon will put in a combination of plates, screws, or other types of hardware to hold the bones in place. The hardware that is put in during the procedure will likely be left in place after you heal. You will likely not know that it is there. Tell a health care provider about:  Any allergies you have.  All medicines you are taking, including vitamins, herbs, eye drops, creams, and over-the-counter medicines.  Any problems you or family members have had with anesthetic medicines.  Any blood disorders you have.  Any surgeries you have had.  Any medical conditions you have.  Whether you are pregnant or may be pregnant. What are the risks? Generally, this is a safe procedure. However, problems may occur, including:  Excessive bleeding.  Infection.  Allergic reactions to medicines.  Damage to nerves or blood vessels.  Shoulder stiffness.  Failure of the bone to heal (nonunion). What happens before the procedure? Staying hydrated Follow instructions from your health care provider about hydration, which may include:  Up to 2 hours before the procedure - you may continue to drink clear liquids, such as water, clear fruit juice, black coffee, and plain tea. Eating and drinking restrictions Follow instructions from your health care provider about eating and drinking, which may include:  8 hours before the procedure - stop eating heavy meals or foods such as meat, fried foods, or fatty foods.  6 hours before  the procedure - stop eating light meals or foods, such as toast or cereal.  6 hours before the procedure - stop drinking milk or drinks that contain milk.  2 hours before the procedure - stop drinking clear liquids. General instructions  Ask your health care provider about: ? Changing or stopping your regular medicines. This is especially important if you are taking diabetes medicines or blood thinners. ? Taking medicines such as aspirin and ibuprofen. These medicines can thin your blood. Do not take these medicines unless your health care provider tells you to take them. ? Taking over-the-counter medicines, vitamins, herbs, and supplements.  Plan to have someone take you home from the hospital or clinic.  Plan to have a responsible adult care for you for at least 24 hours after you leave the hospital or clinic. This is important.  Ask your health care provider how your surgical site will be marked or identified.  You may be asked to shower with a germ-killing soap.  You may be given antibiotic medicine to help prevent an infection. What happens during the procedure?  To lower your risk of infection: ? Your health care team will wash or sanitize their hands. ? Hair may be removed from the surgical area. ? Your skin will be washed with soap.  An IV will be inserted into one of your veins.  You will be given one or more of the following: ? A medicine to help you relax (sedative). ? A medicine to numb the area (local anesthetic). ? A medicine to make you  fall asleep (general anesthetic). ? A medicine that is injected into your spine to numb the area below and slightly above the injection site (spinal anesthetic). ? A medicine that is injected into an area of your body to numb everything below the injection site (regional anesthetic).  The surgeon will make an incision through your skin over the area of the fracture.  The broken bones will be returned to their normal positions. The  surgeon will use screws and a metal plate or different types of wiring to hold the bones in place.  The surgeon will close the incision with stitches (sutures) or staples.  A bandage (dressing) will be placed over your incision.  A shoulder immobilizer may be placed to keep your arm in position. The procedure may vary among health care providers and hospitals. What happens after the procedure?  Your blood pressure, heart rate, breathing rate, and blood oxygen level will be monitored until the medicines you were given have worn off.  You will be given medicine for pain as needed.  You may have physical therapy while you are in the hospital.  You may be sent home with a sling to support your arm.  Do not drive for 24 hours if you were given a sedative during your procedure. Summary  A humerus fracture is a break in the upper arm bone (humerus).  To repair this fracture with ORIF, your surgeon will make an incision over the bone then move parts of the bone back into the right position.  A combination of screws, metal plates, and other types of hardware will be used to hold the parts of the bone in place.  The incision will be closed with stitches (sutures) or staples, and a bandage (dressing) will be placed over it.  You may be sent home with a sling to support your arm. This information is not intended to replace advice given to you by your health care provider. Make sure you discuss any questions you have with your health care provider. Document Released: 03/03/2001 Document Revised: 06/18/2017 Document Reviewed: 06/18/2017 Elsevier Interactive Patient Education  2019 [employer].

## 2018-10-31 NOTE — Progress Notes (Signed)
Progress Note   Patient ID: Stephanie Khan, female   DOB: [birth year], 58 y.o.   MRN: [id number]   Chief Complaint  Patient presents with  . Arm Injury    left humerus fracture 04/13/18     HPI The patient presents for evaluation of left humerus complaining of pain and inability to abduct the arm.  Location left humerus at the fracture site Duration 6-1/2 months Quality primarily dull and intermittently sharp Severity moderate at rest severe with movement Associated with weakness  Review of Systems  Constitutional: Negative for fever.  Respiratory: Negative for shortness of breath.   Cardiovascular: Negative for chest pain.  Skin: Negative.   Neurological: Negative for tingling.  All other systems reviewed and are negative.  Current Meds  Medication Sig  . acetaminophen (TYLENOL) 500 MG tablet Take 1 tablet (500 mg total) by mouth every 6 (six) hours as needed.  Marland Kitchen acidophilus (RISAQUAD) CAPS capsule Take 1 capsule by mouth daily.  Marland Kitchen aspirin EC 81 MG tablet Take 1 tablet (81 mg total) by mouth daily.  . clonazePAM (KLONOPIN) 0.5 MG tablet Take 0.5 mg by mouth daily as needed.   Marland Kitchen ibuprofen (ADVIL,MOTRIN) 600 MG tablet Take 1 tablet (600 mg total) by mouth every 6 (six) hours as needed.  Marland Kitchen LANTUS SOLOSTAR 100 UNIT/ML Solostar Pen   . loratadine (CLARITIN) 10 MG tablet Take 1 tablet by mouth daily.  . metFORMIN (GLUCOPHAGE) 1000 MG tablet Take 1,000 mg by mouth 2 (two) times daily with a meal.    . methocarbamol (ROBAXIN) 500 MG tablet Take 500 mg by mouth daily as needed.   Marland Kitchen oxyCODONE-acetaminophen (PERCOCET) 10-325 MG tablet Take 1 tablet by mouth every 4 (four) hours as needed for pain.  . pregabalin (LYRICA) 100 MG capsule   . [DISCONTINUED] insulin glargine (LANTUS) 100 UNIT/ML injection Inject 22 Units into the skin at bedtime.  . [DISCONTINUED] pregabalin (LYRICA) 50 MG capsule Take 50 mg by mouth daily as needed.    Family History  Problem Relation Age of Onset  .  Diabetes Mother   . Diabetes Maternal Aunt   . Colon cancer Neg Hx    Social History   Tobacco Use  . Smoking status: Current Every Day Smoker    Packs/day: 1.00    Years: 35.00    Pack years: 35.00    Types: Cigarettes  . Smokeless tobacco: Never Used  Substance Use Topics  . Alcohol use: Yes    Comment: rare  . Drug use: No   Past Medical History:  Diagnosis Date  . Asthma   . COPD (chronic obstructive pulmonary disease) ([place])   . Depression   . Diabetes mellitus   . Restless leg   . S/P colonoscopy    Dr. Posey Pronto in [place] 2 years ago     Past Medical History:  Diagnosis Date  . Asthma   . COPD (chronic obstructive pulmonary disease) ([place])   . Depression   . Diabetes mellitus   . Restless leg   . S/P colonoscopy    Dr. Posey Pronto in [place] 2 years ago     No Known Allergies   BP 136/82   Pulse 84   Ht 5\' 4"  (1.626 m)   Wt 200 lb (90.7 kg)   BMI 34.33 kg/m    Physical Exam General appearance normal Oriented x3 normal Mood pleasant affect normal Gait normal   Ortho Exam Right upper Extremity Inspection and palpation revealed no abnormalities Range of motion is  full No instability was detected on stress testing Muscle tone and strength was normal without tremor Skin was warm dry and intact Good pulse and temperature were noted in the extremity Sensation revealed no abnormalities to light touch  Left humerus motion at fracture site tenderness at fracture site radial nerve function intact thumb extension finger MP joint extension wrist extension no sensory deficits poor abduction poor flexion arm basically sits at the side with large fragment poking the skin but not threatening it   MEDICAL DECISION MAKING   Imaging:  Today's film shows a large gap in the fracture site a comminuted fracture large butterfly fragment no bony union.   Encounter Diagnosis  Name Primary?  . 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 Alternative treatment has also been reviewed, questions were encouraged and answered. The postoperative plan is also been reviewed.  No orders of the defined types were placed in this encounter.  2:43 PM 10/31/2018

## 2018-11-03 ENCOUNTER — Telehealth: Payer: Self-pay | Admitting: Radiology

## 2018-11-03 NOTE — Telephone Encounter (Signed)
-----   Message from Josue Hector sent at 11/02/2018  2:18 PM EST ----- No, not yet.  But I was able to leave a VM today.  Thanks,  Hoyle Sauer ----- Message ----- From: Candice Camp, RT Sent: 11/02/2018   1:16 PM EST To: Victory Dakin Young  Have you been able to get her? I have not been able to reach, will keep trying  ----- Message ----- From: Josue Hector Sent: 11/01/2018   2:32 PM EST To: Candice Camp, RT  Hey,  I am not able to get an answer or VM for this patient or her emergency contact to schedule her PAT.  Do you have any different #'s or do you want to try to get her & get her to call me.  Since her surgery is next week.  I didn't want to wait too long.  Thanks,  Hoyle Sauer

## 2018-11-03 NOTE — Telephone Encounter (Signed)
Left message for patient to call sch preop and to call me too

## 2018-11-04 NOTE — Patient Instructions (Addendum)
Your procedure is scheduled on: 11/10/2018  Report to [name] at  7:10   AM.  Call this number if you have problems the morning of surgery: [phone]   Remember:   Do not drink or eat food:After Midnight.  :  Take these medicines the morning of surgery with A SIP OF WATER: Claritin, Robaxin and/or lyrica if needed              No diabetic medication morning of surgery              Only take 1/2 dose of insulin Lantus            Units   night before surgery   Do not wear jewelry, make-up or nail polish.  Do not wear lotions, powders, or perfumes. You may wear deodorant.  Do not shave 48 hours prior to surgery. Men may shave face and neck.  Do not bring valuables to the hospital.  Contacts, dentures or bridgework may not be worn into surgery.  Leave suitcase in the car. After surgery it may be brought to your room.  For patients admitted to the hospital, checkout time is 11:00 AM the day of discharge.   Patients discharged the day of surgery will not be allowed to drive home.    Special Instructions: Shower using CHG night before surgery and shower the day of surgery use CHG.  Use special wash - you have one bottle of CHG for all showers.  You should use approximately 1/2 of the bottle for each shower.  Humerus Fracture Treated With ORIF  A humerus fracture is a break in the in the upper arm bone (humerus). If the fracture is displaced, that means that one or more parts of the bone have been moved out of their normal position and are not lined up correctly. Open reduction with internal fixation (ORIF) is a surgical procedure that may be used to treat a humerus fracture that is displaced. In this procedure, a surgeon will move parts of the bone back into the right position. The surgeon will put in a combination of plates, screws, or other types of hardware to hold the bones in place. The hardware that is put in during the procedure will likely be left in place after you heal. You will  likely not know that it is there. Tell a health care provider about:  Any allergies you have.  All medicines you are taking, including vitamins, herbs, eye drops, creams, and over-the-counter medicines.  Any problems you or family members have had with anesthetic medicines.  Any blood disorders you have.  Any surgeries you have had.  Any medical conditions you have.  Whether you are pregnant or may be pregnant. What are the risks? Generally, this is a safe procedure. However, problems may occur, including:  Excessive bleeding.  Infection.  Allergic reactions to medicines.  Damage to nerves or blood vessels.  Shoulder stiffness.  Failure of the bone to heal (nonunion). What happens before the procedure?   General instructions  Ask your health care provider about: ? Changing or stopping your regular medicines. This is especially important if you are taking diabetes medicines or blood thinners. ? Taking medicines such as aspirin and ibuprofen. These medicines can thin your blood. Do not take these medicines unless your health care provider tells you to take them. ? Taking over-the-counter medicines, vitamins, herbs, and supplements.  Plan to have someone take you home from the hospital or clinic.  Plan to  have a responsible adult care for you for at least 24 hours after you leave the hospital or clinic. This is important.  Ask your health care provider how your surgical site will be marked or identified.  You may be asked to shower with a germ-killing soap.  You may be given antibiotic medicine to help prevent an infection. What happens during the procedure?  To lower your risk of infection: ? Your health care team will wash or sanitize their hands. ? Hair may be removed from the surgical area. ? Your skin will be washed with soap.  An IV will be inserted into one of your veins.  You will be given one or more of the following: ? A medicine to help you relax  (sedative). ? A medicine to numb the area (local anesthetic). ? A medicine to make you fall asleep (general anesthetic). ? A medicine that is injected into your spine to numb the area below and slightly above the injection site (spinal anesthetic). ? A medicine that is injected into an area of your body to numb everything below the injection site (regional anesthetic).  The surgeon will make an incision through your skin over the area of the fracture.  The broken bones will be returned to their normal positions. The surgeon will use screws and a metal plate or different types of wiring to hold the bones in place.  The surgeon will close the incision with stitches (sutures) or staples.  A bandage (dressing) will be placed over your incision.  A shoulder immobilizer may be placed to keep your arm in position. The procedure may vary among health care providers and hospitals. What happens after the procedure?  Your blood pressure, heart rate, breathing rate, and blood oxygen level will be monitored until the medicines you were given have worn off.  You will be given medicine for pain as needed.  You may have physical therapy while you are in the hospital.  You may be sent home with a sling to support your arm.  Do not drive for 24 hours if you were given a sedative during your procedure. Summary  A humerus fracture is a break in the upper arm bone (humerus).  To repair this fracture with ORIF, your surgeon will make an incision over the bone then move parts of the bone back into the right position.  A combination of screws, metal plates, and other types of hardware will be used to hold the parts of the bone in place.  The incision will be closed with stitches (sutures) or staples, and a bandage (dressing) will be placed over it.  You may be sent home with a sling to support your arm. This information is not intended to replace advice given to you by your health care provider. Make  sure you discuss any questions you have with your health care provider. Document Released: 03/03/2001 Document Revised: 06/18/2017 Document Reviewed: 06/18/2017 Elsevier Interactive Patient Education  2019 [place].  Humerus Fracture Treated With ORIF, Care After This sheet gives you information about how to care for yourself after your procedure. Your health care provider may also give you more specific instructions. If you have problems or questions, contact your health care provider. What can I expect after the procedure? After the procedure, it is common to have:  Pain.  Swelling.  Stiffness.  Small amount of fluid from the incision. Follow these instructions at home: Bathing  Do not take baths, swim, or use a hot tub  until your health care provider approves. Ask your health care provider if you can take showers. You may only be allowed to take sponge baths.  If your sling or immobilizer is not waterproof, cover it with a watertight covering when you take a bath or a shower.  Keep the bandage (dressing) dry until your health care provider says it can be removed. If you have a sling or a shoulder immobilizer:  Wear the sling or immobilizer as told by your health care provider. Remove it only as told by your health care provider.  Loosen the sling or immobilizer if your fingers tingle, become numb, or turn cold and blue.  Keep the sling or immobilizer clean.  If the sling or immobilizer is not waterproof: ? Do not let it get wet. ? Cover it with a watertight covering when you take a bath or a shower. Incision care   Follow instructions from your health care provider about how to take care of your incision. Make sure you: ? Wash your hands with soap and water before you change your dressing. If soap and water are not available, use hand sanitizer. ? Change your dressing as told by your health care provider. ? Leave stitches (sutures), skin glue, or adhesive strips in  place. These skin closures may need to stay in place for 2 weeks or longer. If adhesive strip edges start to loosen and curl up, you may trim the loose edges. Do not remove adhesive strips completely unless your health care provider tells you to do that.  Check your incision area every day for signs of infection. Check for: ? Redness. ? More swelling or pain. ? Blood or more fluid. ? Warmth. ? Pus or a bad smell. Managing pain, stiffness, and swelling   If directed, put ice on the affected area. ? Put ice in a plastic bag or use the icing device (cold therapy unit) that you were given. Follow instructions from your health care provider about how to use the icing device. ? Place a towel between your skin and the bag or icing device. ? Leave the ice or icing device on for 20 minutes, 2-3 times a day.  Move your fingers often to avoid stiffness and to lessen swelling.  If told by your health care provider, raise (elevate) the injured area above the level of your heart while you are sitting or lying down. To do this, try putting a few pillows under your arm. Driving  Do not drive or use heavy machinery while taking prescription pain medicine.  Ask your health care provider when it is safe to drive if you have a sling or immobilizer. Activity  Return to your normal activities as told by your health care provider. Ask your health care provider what activities are safe for you.  Do exercises as told by your health care provider.  Do not lift with your injured arm until your health care provider approves.  Avoid pulling and pushing.  Follow instructions from your health care provider about: ? Whether you may gently use your hand and elbow immediately after surgery. ? When to start doing gentle range of motion movements with your shoulder and elbow. It is important to do these movements to prevent loss of motion. General instructions  Take over-the-counter and prescription medicines  only as told by your health care provider.  Do not use any products that contain nicotine or tobacco, such as cigarettes and e-cigarettes. These can delay bone healing. If you need  help quitting, ask your health care provider.  If you are taking prescription pain medicine, take actions to prevent or treat constipation. Your health care provider may recommend that you: ? Drink enough fluid to keep your urine pale yellow. ? Eat foods that are high in fiber, such as fresh fruits and vegetables, whole grains, and beans. ? Limit foods that are high in fat and processed sugars, such as fried or sweet foods. ? Take an over-the-counter or prescription medicine for constipation.  Keep all follow-up visits as told by your health care provider. This is important. Contact a health care provider if:  You have pain that is not helped by medicine.  You have a fever.  You have redness around your incision.  You have more swelling or pain around your incision.  You have blood or more fluid coming from your incision.  Your incision feels warm to the touch.  You have pus or a bad smell coming from your incision or your dressing.  You feel faint or light-headed.  You develop a rash. Get help right away if:  The edges of your incision come apart after the stitches or staples have been taken out.  You have trouble breathing.  You have numbness or tingling in your hand or fingers. Summary  After this procedure, it is common to have some pain, swelling, or stiffness.  If your sling or immobilizer is not waterproof, do not let it get wet.  Contact your health care provider if you have blood or more fluid coming from your incision.  Get medical help right away if you have numbness or tingling in your hands or fingers. This information is not intended to replace advice given to you by your health care provider. Make sure you discuss any questions you have with your health care provider. Document  Released: 03/27/2005 Document Revised: 06/23/2017 Document Reviewed: 06/23/2017 Elsevier Interactive Patient Education  2019 [place] Anesthesia, Adult, Care After This sheet gives you information about how to care for yourself after your procedure. Your health care provider may also give you more specific instructions. If you have problems or questions, contact your health care provider. What can I expect after the procedure? After the procedure, the following side effects are common:  Pain or discomfort at the IV site.  Nausea.  Vomiting.  Sore throat.  Trouble concentrating.  Feeling cold or chills.  Weak or tired.  Sleepiness and fatigue.  Soreness and body aches. These side effects can affect parts of the body that were not involved in surgery. Follow these instructions at home:  For at least 24 hours after the procedure:  Have a responsible adult stay with you. It is important to have someone help care for you until you are awake and alert.  Rest as needed.  Do not: ? Participate in activities in which you could fall or become injured. ? Drive. ? Use heavy machinery. ? Drink alcohol. ? Take sleeping pills or medicines that cause drowsiness. ? Make important decisions or sign legal documents. ? Take care of children on your own. Eating and drinking  Follow any instructions from your health care provider about eating or drinking restrictions.  When you feel hungry, start by eating small amounts of foods that are soft and easy to digest (bland), such as toast. Gradually return to your regular diet.  Drink enough fluid to keep your urine pale yellow.  If you vomit, rehydrate by drinking water, juice, or clear broth. General  instructions  If you have sleep apnea, surgery and certain medicines can increase your risk for breathing problems. Follow instructions from your health care provider about wearing your sleep device: ? Anytime you are sleeping,  including during daytime naps. ? While taking prescription pain medicines, sleeping medicines, or medicines that make you drowsy.  Return to your normal activities as told by your health care provider. Ask your health care provider what activities are safe for you.  Take over-the-counter and prescription medicines only as told by your health care provider.  If you smoke, do not smoke without supervision.  Keep all follow-up visits as told by your health care provider. This is important. Contact a health care provider if:  You have nausea or vomiting that does not get better with medicine.  You cannot eat or drink without vomiting.  You have pain that does not get better with medicine.  You are unable to pass urine.  You develop a skin rash.  You have a fever.  You have redness around your IV site that gets worse. Get help right away if:  You have difficulty breathing.  You have chest pain.  You have blood in your urine or stool, or you vomit blood. Summary  After the procedure, it is common to have a sore throat or nausea. It is also common to feel tired.  Have a responsible adult stay with you for the first 24 hours after general anesthesia. It is important to have someone help care for you until you are awake and alert.  When you feel hungry, start by eating small amounts of foods that are soft and easy to digest (bland), such as toast. Gradually return to your regular diet.  Drink enough fluid to keep your urine pale yellow.  Return to your normal activities as told by your health care provider. Ask your health care provider what activities are safe for you. This information is not intended to replace advice given to you by your health care provider. Make sure you discuss any questions you have with your health care provider. Document Released: 12/14/2000 Document Revised: 04/23/2017 Document Reviewed: 04/23/2017 Elsevier Interactive Patient Education  2019 [employer].

## 2018-11-04 NOTE — Patient Instructions (Addendum)
   Your procedure is scheduled on:   Report [name] at     AM.  Call this number if you have problems the morning of surgery: [phone]   Remember:   Do not drink or eat food:After Midnight.  :  Take these medicines the morning of surgery with A SIP OF WATER: Claritin, Robaxin and/or lyrica if needed              No diabetic medication morning of surgery              Only take 1/2 dose of insulin Lantus            Units   night before surgery   Do not wear jewelry, make-up or nail polish.  Do not wear lotions, powders, or perfumes. You may wear deodorant.  Do not shave 48 hours prior to surgery. Men may shave face and neck.  Do not bring valuables to the hospital.  Contacts, dentures or bridgework may not be worn into surgery.  Leave suitcase in the car. After surgery it may be brought to your room.  For patients admitted to the hospital, checkout time is 11:00 AM the day of discharge.   Patients discharged the day of surgery will not be allowed to drive home.    Special Instructions: Shower using CHG night before surgery and shower the day of surgery use CHG.  Use special wash - you have one bottle of CHG for all showers.  You should use approximately 1/2 of the bottle for each shower.

## 2018-11-07 ENCOUNTER — Encounter (HOSPITAL_COMMUNITY)
Admission: RE | Admit: 2018-11-07 | Discharge: 2018-11-07 | Disposition: A | Discharge status: Home / Self Care | Payer: BLUE CROSS/BLUE SHIELD | Source: Ambulatory Visit | Attending: Orthopedic Surgery | Admitting: Orthopedic Surgery

## 2018-11-07 DIAGNOSIS — Z01812 Encounter for preprocedural laboratory examination: Secondary | ICD-10-CM | POA: Diagnosis present

## 2018-11-07 LAB — CBC WITH DIFFERENTIAL/PLATELET
Abs Immature Granulocytes: 0.01 10*3/uL (ref 0.00–0.07)
Basophils Absolute: 0.1 10*3/uL (ref 0.0–0.1)
Basophils Relative: 1 %
Eosinophils Absolute: 0.2 10*3/uL (ref 0.0–0.5)
Eosinophils Relative: 2 %
HCT: 43.4 % (ref 36.0–46.0)
Hemoglobin: 13.6 g/dL (ref 12.0–15.0)
IMMATURE GRANULOCYTES: 0 %
Lymphocytes Relative: 23 %
Lymphs Abs: 1.9 10*3/uL (ref 0.7–4.0)
MCH: 28.3 pg (ref 26.0–34.0)
MCHC: 31.3 g/dL (ref 30.0–36.0)
MCV: 90.4 fL (ref 80.0–100.0)
Monocytes Absolute: 0.6 10*3/uL (ref 0.1–1.0)
Monocytes Relative: 7 %
Neutro Abs: 5.6 10*3/uL (ref 1.7–7.7)
Neutrophils Relative %: 67 %
Platelets: 275 10*3/uL (ref 150–400)
RBC: 4.8 MIL/uL (ref 3.87–5.11)
RDW: 13.2 % (ref 11.5–15.5)
WBC: 8.3 10*3/uL (ref 4.0–10.5)
nRBC: 0 % (ref 0.0–0.2)

## 2018-11-07 LAB — BASIC METABOLIC PANEL
Anion gap: 12 (ref 5–15)
BUN: 9 mg/dL (ref 6–20)
CO2: 21 mmol/L — ABNORMAL LOW (ref 22–32)
CREATININE: 0.48 mg/dL (ref 0.44–1.00)
Calcium: 9.4 mg/dL (ref 8.9–10.3)
Chloride: 104 mmol/L (ref 98–111)
GFR calc Af Amer: >60 mL/min (ref >60)
GFR calc non Af Amer: >60 mL/min (ref >60)
Glucose, Bld: 137 mg/dL — ABNORMAL HIGH (ref 70–99)
Potassium: 3.7 mmol/L (ref 3.5–5.1)
Sodium: 137 mmol/L (ref 135–145)

## 2018-11-07 LAB — GLUCOSE, CAPILLARY: Glucose-Capillary: 112 mg/dL — ABNORMAL HIGH (ref 70–99)

## 2018-11-08 LAB — HEMOGLOBIN A1C
HEMOGLOBIN A1C: 7.7 % — AB (ref 4.8–5.6)
Mean Plasma Glucose: 174 mg/dL

## 2018-11-09 NOTE — H&P (Signed)
Progress Note    Patient ID: Stephanie Khan, female   DOB: February 29, 1961, 58 y.o.   MRN: [id number]         Chief Complaint  Patient presents with  . Arm Injury      left humerus fracture 04/13/18       HPI The patient presents for evaluation of left humerus complaining of pain and inability to abduct the arm.   Location left humerus at the fracture site Duration 6-1/2 months Quality primarily dull and intermittently sharp Severity moderate at rest severe with movement Associated with weakness   Review of Systems  Constitutional: Negative for fever.  Respiratory: Negative for shortness of breath.   Cardiovascular: Negative for chest pain.  Skin: Negative.   Neurological: Negative for tingling.  All other systems reviewed and are negative.   Active Medications      Current Meds  Medication Sig  . acetaminophen (TYLENOL) 500 MG tablet Take 1 tablet (500 mg total) by mouth every 6 (six) hours as needed.  Marland Kitchen acidophilus (RISAQUAD) CAPS capsule Take 1 capsule by mouth daily.  Marland Kitchen aspirin EC 81 MG tablet Take 1 tablet (81 mg total) by mouth daily.  . clonazePAM (KLONOPIN) 0.5 MG tablet Take 0.5 mg by mouth daily as needed.   Marland Kitchen ibuprofen (ADVIL,MOTRIN) 600 MG tablet Take 1 tablet (600 mg total) by mouth every 6 (six) hours as needed.  Marland Kitchen LANTUS SOLOSTAR 100 UNIT/ML Solostar Pen    . loratadine (CLARITIN) 10 MG tablet Take 1 tablet by mouth daily.  . metFORMIN (GLUCOPHAGE) 1000 MG tablet Take 1,000 mg by mouth 2 (two) times daily with a meal.    . methocarbamol (ROBAXIN) 500 MG tablet Take 500 mg by mouth daily as needed.   Marland Kitchen oxyCODONE-acetaminophen (PERCOCET) 10-325 MG tablet Take 1 tablet by mouth every 4 (four) hours as needed for pain.  . pregabalin (LYRICA) 100 MG capsule    . [DISCONTINUED] insulin glargine (LANTUS) 100 UNIT/ML injection Inject 22 Units into the skin at bedtime.  . [DISCONTINUED] pregabalin (LYRICA) 50 MG capsule Take 50 mg by mouth daily as needed.              Family History  Problem Relation Age of Onset  . Diabetes Mother    . Diabetes Maternal Aunt    . Colon cancer Neg Hx      Social History         Tobacco Use  . Smoking status: Current Every Day Smoker      Packs/day: 1.00      Years: 35.00      Pack years: 35.00      Types: Cigarettes  . Smokeless tobacco: Never Used  Substance Use Topics  . Alcohol use: Yes      Comment: rare  . Drug use: No        Past Medical History:  Diagnosis Date  . Asthma    . COPD (chronic obstructive pulmonary disease) ([place])    . Depression    . Diabetes mellitus    . Restless leg    . S/P colonoscopy      Dr. Posey Pronto in [place] 2 years ago        Past Medical History:  Diagnosis Date  . Asthma    . COPD (chronic obstructive pulmonary disease) ([place])    . Depression    . Diabetes mellitus    . Restless leg    . S/P colonoscopy      Dr.  Patel in [place] 2 years ago        No Known Allergies     BP 136/82   Pulse 84   Ht 5\' 4"  (1.626 m)   Wt 200 lb (90.7 kg)   BMI 34.33 kg/m      Physical Exam General appearance normal Oriented x3 normal Mood pleasant affect normal Gait normal    Ortho Exam Right upper Extremity Inspection and palpation revealed no abnormalities Range of motion is full No instability was detected on stress testing Muscle tone and strength was normal without tremor Skin was warm dry and intact Good pulse and temperature were noted in the extremity Sensation revealed no abnormalities to light touch   Left humerus motion at fracture site tenderness at fracture site radial nerve function intact thumb extension finger MP joint extension wrist extension no sensory deficits poor abduction poor flexion arm basically sits at the side with large fragment poking the skin but not threatening it     MEDICAL DECISION MAKING    Imaging:  Today's film shows a large gap in the fracture site a comminuted fracture large butterfly fragment no bony union.          Encounter Diagnosis  Name Primary?  . 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 Alternative treatment has also been reviewed, questions were encouraged and answered. The postoperative plan is also been reviewed.   No orders of the defined types were placed in this encounter.   2:43 PM 10/31/2018

## 2018-11-10 ENCOUNTER — Observation Stay (HOSPITAL_COMMUNITY)
Admission: RE | Admit: 2018-11-10 | Discharge: 2018-11-11 | Disposition: A | Discharge status: Home / Self Care | Payer: BLUE CROSS/BLUE SHIELD | Attending: Orthopedic Surgery | Admitting: Orthopedic Surgery

## 2018-11-10 ENCOUNTER — Encounter (HOSPITAL_COMMUNITY)
Admission: RE | Disposition: A | Discharge status: Home / Self Care | Payer: Self-pay | Source: Home / Self Care | Attending: Orthopedic Surgery

## 2018-11-10 ENCOUNTER — Other Ambulatory Visit: Payer: Self-pay

## 2018-11-10 ENCOUNTER — Ambulatory Visit (HOSPITAL_COMMUNITY): Payer: BLUE CROSS/BLUE SHIELD | Admitting: Anesthesiology

## 2018-11-10 ENCOUNTER — Ambulatory Visit (HOSPITAL_COMMUNITY): Payer: BLUE CROSS/BLUE SHIELD

## 2018-11-10 ENCOUNTER — Encounter (HOSPITAL_COMMUNITY): Payer: Self-pay | Admitting: *Deleted

## 2018-11-10 DIAGNOSIS — F17210 Nicotine dependence, cigarettes, uncomplicated: Secondary | ICD-10-CM | POA: Insufficient documentation

## 2018-11-10 DIAGNOSIS — S42302B Unspecified fracture of shaft of humerus, left arm, initial encounter for open fracture: Secondary | ICD-10-CM | POA: Diagnosis present

## 2018-11-10 DIAGNOSIS — S42352P Displaced comminuted fracture of shaft of humerus, left arm, subsequent encounter for fracture with malunion: Secondary | ICD-10-CM | POA: Diagnosis not present

## 2018-11-10 DIAGNOSIS — J449 Chronic obstructive pulmonary disease, unspecified: Secondary | ICD-10-CM | POA: Diagnosis not present

## 2018-11-10 DIAGNOSIS — E119 Type 2 diabetes mellitus without complications: Secondary | ICD-10-CM | POA: Diagnosis not present

## 2018-11-10 DIAGNOSIS — S42302A Unspecified fracture of shaft of humerus, left arm, initial encounter for closed fracture: Secondary | ICD-10-CM

## 2018-11-10 DIAGNOSIS — Z794 Long term (current) use of insulin: Secondary | ICD-10-CM | POA: Insufficient documentation

## 2018-11-10 DIAGNOSIS — Z7982 Long term (current) use of aspirin: Secondary | ICD-10-CM | POA: Diagnosis not present

## 2018-11-10 DIAGNOSIS — S42302P Unspecified fracture of shaft of humerus, left arm, subsequent encounter for fracture with malunion: Secondary | ICD-10-CM | POA: Diagnosis present

## 2018-11-10 DIAGNOSIS — G2581 Restless legs syndrome: Secondary | ICD-10-CM | POA: Diagnosis not present

## 2018-11-10 DIAGNOSIS — X58XXXD Exposure to other specified factors, subsequent encounter: Secondary | ICD-10-CM | POA: Insufficient documentation

## 2018-11-10 DIAGNOSIS — S42352K Displaced comminuted fracture of shaft of humerus, left arm, subsequent encounter for fracture with nonunion: Principal | ICD-10-CM | POA: Insufficient documentation

## 2018-11-10 DIAGNOSIS — Z79899 Other long term (current) drug therapy: Secondary | ICD-10-CM | POA: Diagnosis not present

## 2018-11-10 HISTORY — PX: ORIF HUMERUS FRACTURE: SHX2126

## 2018-11-10 LAB — GLUCOSE, CAPILLARY
Glucose-Capillary: 119 mg/dL — ABNORMAL HIGH (ref 70–99)
Glucose-Capillary: 125 mg/dL — ABNORMAL HIGH (ref 70–99)
Glucose-Capillary: 139 mg/dL — ABNORMAL HIGH (ref 70–99)
Glucose-Capillary: 202 mg/dL — ABNORMAL HIGH (ref 70–99)

## 2018-11-10 SURGERY — OPEN REDUCTION INTERNAL FIXATION (ORIF) HUMERAL SHAFT FRACTURE
Anesthesia: Regional | Site: Shoulder | Laterality: Left

## 2018-11-10 MED ORDER — METHOCARBAMOL 500 MG PO TABS
500.0000 mg | ORAL_TABLET | Freq: Every evening | ORAL | Status: DC | PRN
  Administered 2018-11-10: 500 mg via ORAL
  Filled 2018-11-10: qty 1

## 2018-11-10 MED ORDER — SUGAMMADEX SODIUM 500 MG/5ML IV SOLN
INTRAVENOUS | Status: DC | PRN
  Administered 2018-11-10: 150 mg via INTRAVENOUS

## 2018-11-10 MED ORDER — CHLORHEXIDINE GLUCONATE 4 % EX LIQD: 60.0000 mL | Freq: Once | CUTANEOUS | Status: DC

## 2018-11-10 MED ORDER — METFORMIN HCL 500 MG PO TABS
1000.0000 mg | ORAL_TABLET | Freq: Two times a day (BID) | ORAL | Status: DC
  Administered 2018-11-10 – 2018-11-11 (×2): 1000 mg via ORAL
  Filled 2018-11-10 (×2): qty 2

## 2018-11-10 MED ORDER — ASPIRIN EC 81 MG PO TBEC
81.0000 mg | DELAYED_RELEASE_TABLET | Freq: Every day | ORAL | Status: DC
  Administered 2018-11-10 – 2018-11-11 (×2): 81 mg via ORAL
  Filled 2018-11-10 (×2): qty 1

## 2018-11-10 MED ORDER — RISAQUAD PO CAPS
1.0000 | ORAL_CAPSULE | Freq: Every day | ORAL | Status: DC
  Administered 2018-11-10 – 2018-11-11 (×2): 1 via ORAL
  Filled 2018-11-10 (×2): qty 1

## 2018-11-10 MED ORDER — CALCIUM CARBONATE 1250 (500 CA) MG PO TABS
500.0000 mg | ORAL_TABLET | Freq: Every day | ORAL | Status: DC
  Administered 2018-11-10 – 2018-11-11 (×2): 500 mg via ORAL
  Filled 2018-11-10 (×2): qty 1

## 2018-11-10 MED ORDER — LORATADINE 10 MG PO TABS: 10.0000 mg | ORAL_TABLET | Freq: Every day | ORAL | Status: DC | PRN

## 2018-11-10 MED ORDER — ONDANSETRON HCL 4 MG/2ML IJ SOLN
INTRAMUSCULAR | Status: AC
  Filled 2018-11-10: qty 2

## 2018-11-10 MED ORDER — PROMETHAZINE HCL 25 MG/ML IJ SOLN: 6.2500 mg | INTRAMUSCULAR | Status: DC | PRN

## 2018-11-10 MED ORDER — ROPIVACAINE HCL 5 MG/ML IJ SOLN
INTRAMUSCULAR | Status: AC
  Filled 2018-11-10: qty 30

## 2018-11-10 MED ORDER — HYDROMORPHONE HCL 1 MG/ML IJ SOLN: 0.2500 mg | INTRAMUSCULAR | Status: DC | PRN

## 2018-11-10 MED ORDER — HYDROMORPHONE HCL 1 MG/ML IJ SOLN: 0.5000 mg | INTRAMUSCULAR | Status: DC | PRN

## 2018-11-10 MED ORDER — BUPIVACAINE-EPINEPHRINE (PF) 0.25% -1:200000 IJ SOLN
INTRAMUSCULAR | Status: AC
  Filled 2018-11-10: qty 30

## 2018-11-10 MED ORDER — SODIUM CHLORIDE 0.9 % IV SOLN
INTRAVENOUS | Status: DC
  Administered 2018-11-10 – 2018-11-11 (×2): via INTRAVENOUS

## 2018-11-10 MED ORDER — BUPIVACAINE-EPINEPHRINE (PF) 0.5% -1:200000 IJ SOLN
INTRAMUSCULAR | Status: AC
  Filled 2018-11-10: qty 60

## 2018-11-10 MED ORDER — ROCURONIUM BROMIDE 100 MG/10ML IV SOLN
INTRAVENOUS | Status: DC | PRN
  Administered 2018-11-10: 40 mg via INTRAVENOUS
  Administered 2018-11-10: 10 mg via INTRAVENOUS
  Administered 2018-11-10: 20 mg via INTRAVENOUS
  Administered 2018-11-10: 10 mg via INTRAVENOUS

## 2018-11-10 MED ORDER — HYDROCODONE-ACETAMINOPHEN 7.5-325 MG PO TABS: 1.0000 | ORAL_TABLET | Freq: Once | ORAL | Status: DC | PRN

## 2018-11-10 MED ORDER — BUPIVACAINE HCL (PF) 0.5 % IJ SOLN
INTRAMUSCULAR | Status: AC
  Filled 2018-11-10: qty 30

## 2018-11-10 MED ORDER — HYDROMORPHONE HCL 1 MG/ML IJ SOLN
0.2500 mg | INTRAMUSCULAR | Status: DC | PRN
  Administered 2018-11-10 (×2): 0.5 mg via INTRAVENOUS

## 2018-11-10 MED ORDER — PROPOFOL 10 MG/ML IV BOLUS
INTRAVENOUS | Status: AC
  Filled 2018-11-10: qty 20

## 2018-11-10 MED ORDER — CLONAZEPAM 0.5 MG PO TABS: 0.5000 mg | ORAL_TABLET | Freq: Two times a day (BID) | ORAL | Status: DC | PRN

## 2018-11-10 MED ORDER — EPHEDRINE SULFATE 50 MG/ML IJ SOLN
INTRAMUSCULAR | Status: DC | PRN
  Administered 2018-11-10 (×2): 10 mg via INTRAVENOUS
  Administered 2018-11-10 (×3): 5 mg via INTRAVENOUS

## 2018-11-10 MED ORDER — SUGAMMADEX SODIUM 200 MG/2ML IV SOLN
INTRAVENOUS | Status: AC
  Filled 2018-11-10: qty 2

## 2018-11-10 MED ORDER — MIDAZOLAM HCL 2 MG/2ML IJ SOLN: 0.5000 mg | Freq: Once | INTRAMUSCULAR | Status: DC | PRN

## 2018-11-10 MED ORDER — CEFAZOLIN SODIUM-DEXTROSE 2-4 GM/100ML-% IV SOLN
2.0000 g | INTRAVENOUS | Status: AC
  Administered 2018-11-10: 2 g via INTRAVENOUS

## 2018-11-10 MED ORDER — PROPOFOL 10 MG/ML IV BOLUS
INTRAVENOUS | Status: DC | PRN
  Administered 2018-11-10: 20 mg via INTRAVENOUS
  Administered 2018-11-10: 150 mg via INTRAVENOUS
  Administered 2018-11-10: 30 mg via INTRAVENOUS

## 2018-11-10 MED ORDER — HYDROCODONE-ACETAMINOPHEN 7.5-325 MG PO TABS
ORAL_TABLET | ORAL | Status: AC
  Filled 2018-11-10: qty 1

## 2018-11-10 MED ORDER — ONDANSETRON HCL 4 MG/2ML IJ SOLN
INTRAMUSCULAR | Status: DC | PRN
  Administered 2018-11-10: 4 mg via INTRAVENOUS

## 2018-11-10 MED ORDER — HYDROMORPHONE HCL 1 MG/ML IJ SOLN
INTRAMUSCULAR | Status: AC
  Filled 2018-11-10: qty 0.5

## 2018-11-10 MED ORDER — SODIUM CHLORIDE 0.9 % IR SOLN
Status: DC | PRN
  Administered 2018-11-10: 1000 mL

## 2018-11-10 MED ORDER — ROPIVACAINE HCL 5 MG/ML IJ SOLN: INTRAMUSCULAR | Status: DC | PRN

## 2018-11-10 MED ORDER — LACTATED RINGERS IV SOLN
INTRAVENOUS | Status: DC
  Administered 2018-11-10: 13:00:00 via INTRAVENOUS
  Administered 2018-11-10: 1000 mL via INTRAVENOUS

## 2018-11-10 MED ORDER — FENTANYL CITRATE (PF) 250 MCG/5ML IJ SOLN
INTRAMUSCULAR | Status: AC
  Filled 2018-11-10: qty 5

## 2018-11-10 MED ORDER — OXYCODONE-ACETAMINOPHEN 5-325 MG PO TABS
2.0000 | ORAL_TABLET | ORAL | Status: DC | PRN
  Administered 2018-11-10 – 2018-11-11 (×3): 2 via ORAL
  Filled 2018-11-10 (×3): qty 2

## 2018-11-10 MED ORDER — GLYCOPYRROLATE 0.2 MG/ML IJ SOLN
INTRAMUSCULAR | Status: DC | PRN
  Administered 2018-11-10: 0.2 mg via INTRAVENOUS

## 2018-11-10 MED ORDER — FENTANYL CITRATE (PF) 100 MCG/2ML IJ SOLN
INTRAMUSCULAR | Status: DC | PRN
  Administered 2018-11-10: 25 ug via INTRAVENOUS
  Administered 2018-11-10: 75 ug via INTRAVENOUS
  Administered 2018-11-10: 25 ug via INTRAVENOUS

## 2018-11-10 MED ORDER — DOCUSATE SODIUM 100 MG PO CAPS
100.0000 mg | ORAL_CAPSULE | Freq: Two times a day (BID) | ORAL | Status: DC
  Administered 2018-11-10 – 2018-11-11 (×2): 100 mg via ORAL
  Filled 2018-11-10 (×2): qty 1

## 2018-11-10 MED ORDER — SUCCINYLCHOLINE CHLORIDE 20 MG/ML IJ SOLN
INTRAMUSCULAR | Status: DC | PRN
  Administered 2018-11-10: 140 mg via INTRAVENOUS

## 2018-11-10 MED ORDER — CEFAZOLIN SODIUM-DEXTROSE 2-4 GM/100ML-% IV SOLN
INTRAVENOUS | Status: AC
  Filled 2018-11-10: qty 100

## 2018-11-10 MED ORDER — 0.9 % SODIUM CHLORIDE (POUR BTL) OPTIME
TOPICAL | Status: DC | PRN
  Administered 2018-11-10: 1000 mL

## 2018-11-10 MED ORDER — INSULIN GLARGINE 100 UNIT/ML ~~LOC~~ SOLN
22.0000 [IU] | SUBCUTANEOUS | Status: DC
  Administered 2018-11-11: 22 [IU] via SUBCUTANEOUS
  Filled 2018-11-10 (×4): qty 0.22

## 2018-11-10 MED ORDER — ROPIVACAINE HCL 5 MG/ML IJ SOLN
INTRAMUSCULAR | Status: DC | PRN
  Administered 2018-11-10: 30 mL via EPIDURAL

## 2018-11-10 MED ORDER — BUPIVACAINE-EPINEPHRINE (PF) 0.5% -1:200000 IJ SOLN
INTRAMUSCULAR | Status: DC | PRN
  Administered 2018-11-10: 30 mL

## 2018-11-10 MED ORDER — HYDROCODONE-ACETAMINOPHEN 7.5-325 MG PO TABS
1.0000 | ORAL_TABLET | Freq: Once | ORAL | Status: AC | PRN
  Administered 2018-11-10: 1 via ORAL

## 2018-11-10 SURGICAL SUPPLY — 68 items
BIT DRILL 2.5X110 QC LCP DISP (BIT) ×3 IMPLANT
BIT DRILL PERC QC 2.8X200 100 (BIT) ×1 IMPLANT
BLADE 10 SAFETY STRL DISP (BLADE) ×6 IMPLANT
BLADE 11 SAFETY STRL DISP (BLADE) ×3 IMPLANT
BLADE HEX COATED 2.75 (ELECTRODE) ×3 IMPLANT
BNDG COHESIVE 4X5 TAN STRL (GAUZE/BANDAGES/DRESSINGS) ×3 IMPLANT
CHLORAPREP W/TINT 26ML (MISCELLANEOUS) ×3 IMPLANT
CLOTH BEACON ORANGE TIMEOUT ST (SAFETY) ×3 IMPLANT
COVER LIGHT HANDLE STERIS (MISCELLANEOUS) ×6 IMPLANT
COVER MAYO STAND XLG (DRAPE) ×3 IMPLANT
DECANTER SPIKE VIAL GLASS SM (MISCELLANEOUS) ×6 IMPLANT
DRAPE C-ARM FOLDED MOBILE STRL (DRAPES) ×3 IMPLANT
DRAPE HALF SHEET 40X57 (DRAPES) ×3 IMPLANT
DRAPE ORTHO 2.5IN SPLIT 77X108 (DRAPES) ×2 IMPLANT
DRAPE ORTHO SPLIT 77X108 STRL (DRAPES) ×4
DRAPE PROXIMA HALF (DRAPES) ×6 IMPLANT
DRAPE U-SHAPE 47X51 STRL (DRAPES) ×6 IMPLANT
DRILL BIT QUICK COUP 2.8MM 100 (BIT) ×2
ELECT REM PT RETURN 9FT ADLT (ELECTROSURGICAL) ×3
ELECTRODE REM PT RTRN 9FT ADLT (ELECTROSURGICAL) ×1 IMPLANT
GAUZE SPONGE 4X4 12PLY STRL (GAUZE/BANDAGES/DRESSINGS) ×3 IMPLANT
GAUZE XEROFORM 5X9 LF (GAUZE/BANDAGES/DRESSINGS) ×6 IMPLANT
GLOVE BIO SURGEON STRL SZ7 (GLOVE) ×3 IMPLANT
GLOVE BIOGEL PI IND STRL 7.0 (GLOVE) ×1 IMPLANT
GLOVE BIOGEL PI INDICATOR 7.0 (GLOVE) ×2
GLOVE SKINSENSE NS SZ8.0 LF (GLOVE) ×4
GLOVE SKINSENSE STRL SZ8.0 LF (GLOVE) ×2 IMPLANT
GLOVE SS N UNI LF 8.5 STRL (GLOVE) ×3 IMPLANT
GOWN STRL REUS W/ TWL LRG LVL3 (GOWN DISPOSABLE) ×1 IMPLANT
GOWN STRL REUS W/TWL LRG LVL3 (GOWN DISPOSABLE) ×5 IMPLANT
GOWN STRL REUS W/TWL XL LVL3 (GOWN DISPOSABLE) ×3 IMPLANT
INST SET MINOR BONE (KITS) ×3 IMPLANT
KIT TURNOVER KIT A (KITS) ×3 IMPLANT
MANIFOLD NEPTUNE II (INSTRUMENTS) ×3 IMPLANT
MARKER SKIN DUAL TIP RULER LAB (MISCELLANEOUS) ×3 IMPLANT
NEEDLE HYPO 21X1.5 SAFETY (NEEDLE) ×3 IMPLANT
NS IRRIG 1000ML POUR BTL (IV SOLUTION) ×3 IMPLANT
PACK BASIC III (CUSTOM PROCEDURE TRAY) ×2
PACK SRG BSC III STRL LF ECLPS (CUSTOM PROCEDURE TRAY) ×1 IMPLANT
PAD ABD 5X9 TENDERSORB (GAUZE/BANDAGES/DRESSINGS) ×6 IMPLANT
PADDING CAST COTTON 6X4 STRL (CAST SUPPLIES) ×6 IMPLANT
PENCIL HANDSWITCHING (ELECTRODE) ×3 IMPLANT
PLATE HUMERUS LCP PROX 10 HOLE (Plate) ×3 IMPLANT
SCREW CORTEX 3.5 22MM (Screw) ×2 IMPLANT
SCREW CORTEX 3.5 24MM (Screw) ×6 IMPLANT
SCREW CORTEX 3.5 26MM (Screw) ×4 IMPLANT
SCREW LOCK CORT ST 3.5X22 (Screw) ×1 IMPLANT
SCREW LOCK CORT ST 3.5X24 (Screw) ×3 IMPLANT
SCREW LOCK CORT ST 3.5X26 (Screw) ×2 IMPLANT
SCREW LOCK T15 FT 30X3.5X2.9X (Screw) ×1 IMPLANT
SCREW LOCK T15 FT 38X3.5XST (Screw) ×1 IMPLANT
SCREW LOCKING 3.5X30 (Screw) ×2 IMPLANT
SCREW LOCKING 3.5X34 (Screw) ×3 IMPLANT
SCREW LOCKING 3.5X38 (Screw) ×2 IMPLANT
SCREW LOCKING 3.5X42 (Screw) ×6 IMPLANT
SET BASIN LINEN APH (SET/KITS/TRAYS/PACK) ×3 IMPLANT
SLING ARM FOAM STRAP XLG (SOFTGOODS) ×3 IMPLANT
SPONGE LAP 18X18 RF (DISPOSABLE) ×12 IMPLANT
STAPLER VISISTAT 35W (STAPLE) ×6 IMPLANT
STOCKINETTE IMPERVIOUS LG (DRAPES) ×3 IMPLANT
SUT MNCRL 0 VIOLET CTX 36 (SUTURE) ×1 IMPLANT
SUT MON AB 2-0 CT1 36 (SUTURE) ×6 IMPLANT
SUT MONOCRYL 0 CTX 36 (SUTURE) ×2
SYR 30ML LL (SYRINGE) ×3 IMPLANT
SYR BULB IRRIGATION 50ML (SYRINGE) ×3 IMPLANT
TAPE CLOTH SURG 4X10 WHT LF (GAUZE/BANDAGES/DRESSINGS) ×3 IMPLANT
TAPE MEDIFIX FOAM 3 (GAUZE/BANDAGES/DRESSINGS) ×3 IMPLANT
YANKAUER SUCT BULB TIP 10FT TU (MISCELLANEOUS) ×3 IMPLANT

## 2018-11-10 NOTE — Brief Op Note (Addendum)
11/10/2018  1:39 PM  PATIENT:  Stephanie Khan  58 y.o. female  PRE-OPERATIVE DIAGNOSIS:  fracture left humeral shaft  POST-OPERATIVE DIAGNOSIS:  fracture left humeral shaft  PROCEDURE:  Procedure(s): OPEN REDUCTION INTERNAL FIXATION (ORIF) HUMERAL SHAFT FRACTURE (Left)  SURGEON:  Surgeon(s) and Role:    Carole Civil, MD - Primary  The patient was identified in the preop area the left arm was marked for surgery chart review was completed patient confirmed site of surgery patient taken to surgery patient given antibiotics 2 g IV Ancef patient underwent anesthesia by intubation and she also had a preop interscalene block.  Timeout  After sterile prep and drape a deltopectoral and anterolateral approach were performed to expose the proximal humerus and the shaft of the humerus.  There was abundant fracture callus and fibrous union of the bone especially noted was the complete healing of the proximal portion of the fracture and the fibrous union of the mid and distal portion of the fracture.  After exposing the deltopectoral interval and preserving the cephalic vein in the deltopectoral interval explored and the proximal shaft was exposed.  We then turned our attention to the distalmost portion of the fracture we had to take down fracture callus.  We went between the biceps and brachialis and then split the brachialis medial two thirds lateral one third down to bone exposing the fracture.  We did a provisional reduction with the C arm guidance.  We then exposed the fracture site further and debrided it and then once this was done we were able to reduce the fracture with a [place] foot clamp and a large bone clamp  Nerves were exposed including the musculocutaneous nerve underneath the biceps and the radial nerve between the brachial radialis and the brachialis.  These nerves were protected throughout the surgery  We placed the proximal locking screws first and confirmed position with  x-ray we then released some of the deltoid and passed the plate underneath the deltoid.  We use a large bone-holding clamp to reduce the distal portion of the fracture.  We then placed nonlocking screws distally.  Final radiographs confirmed reduction  Wound was irrigated and closed with 0 Monocryl and 2-0 Monocryl.  Sterile dressings were applied patient was placed in a sling  Postoperative plan The patient can start immediate passive range of motion shoulder active range of motion elbow and hand, Dressing change at 2 weeks staples out in 2 weeks  PHYSICIAN ASSISTANT:   ASSISTANTS: betty ashley    ANESTHESIA:   general and paracervical block  EBL:  150 mL   BLOOD ADMINISTERED:none  DRAINS: none   LOCAL MEDICATIONS USED:  MARCAINE     SPECIMEN:  No Specimen  DISPOSITION OF SPECIMEN:  N/A  COUNTS:  YES  TOURNIQUET:  * No tourniquets in log *  DICTATION: .Dragon Dictation  PLAN OF CARE: Admit for overnight observation  PATIENT DISPOSITION:  PACU - hemodynamically stable.   Delay start of Pharmacological VTE agent (>24hrs) due to surgical blood loss or risk of bleeding: not applicable

## 2018-11-10 NOTE — Interval H&P Note (Signed)
History and Physical Interval Note:  11/10/2018 10:23 AM  Stephanie Khan  has presented today for surgery, with the diagnosis of fracture left humeral shaft  The various methods of treatment have been discussed with the patient and family. After consideration of risks, benefits and other options for treatment, the patient has consented to  Procedure(s) with comments: OPEN REDUCTION INTERNAL FIXATION (ORIF) HUMERAL SHAFT FRACTURE left (Left) - pt knows to arrive at 10:15 as a surgical intervention .  The patient's history has been reviewed, patient examined, no change in status, stable for surgery.  I have reviewed the patient's chart and labs.  Questions were answered to the patient's satisfaction.     Arther Abbott

## 2018-11-10 NOTE — Anesthesia Procedure Notes (Signed)
Anesthesia Regional Block: Interscalene brachial plexus block   Pre-Anesthetic Checklist: ,, timeout performed, Correct Patient, Correct Site, Correct Laterality, Correct Procedure, Correct Position, site marked, Risks and benefits discussed, at surgeon's request and post-op pain management  Laterality: Upper and Left  Prep: chloraprep       Needles:  Injection technique: Single-shot  Needle Type: Stimiplex      Needle Gauge: 22     Additional Needles:   Procedures:,,,, ultrasound used (permanent image in chart),,,,   Nerve Stimulator or Paresthesia:  Response: Twitch elicited, 0.5 mA, 0.3 ms,   Additional Responses:   Narrative:  Start time: 11/10/2018 10:07 AM End time: 11/10/2018 10:18 AM Injection made incrementally with aspirations every 5 mL.  Performed by: With CRNAs  Anesthesiologist: Lenice Llamas, MD CRNA: Vista Deck, CRNA  Additional Notes: Block assessed prior to start of surgery Pt tolerated block well -awake conversant throughout  3-5 cc incremental injections  No POI 30cc 0.5% Naropin total  75 ucg fent toal used Pain relief obtained

## 2018-11-10 NOTE — Anesthesia Postprocedure Evaluation (Signed)
Anesthesia Post Note  Patient: Stephanie Khan  Procedure(s) Performed: OPEN REDUCTION INTERNAL FIXATION (ORIF) HUMERAL SHAFT FRACTURE (Left Shoulder)  Patient location during evaluation: PACU Anesthesia Type: Regional Level of consciousness: awake and alert and patient cooperative Pain management: satisfactory to patient Vital Signs Assessment: post-procedure vital signs reviewed and stable Respiratory status: spontaneous breathing Cardiovascular status: stable Postop Assessment: no apparent nausea or vomiting Anesthetic complications: no     Last Vitals:  Vitals:   11/10/18 0947 11/10/18 1300  BP: 121/63   Resp: 18   Temp: 36.6 C 37 C  SpO2: 97% 94%    Last Pain:  Vitals:   11/10/18 1300  TempSrc:   PainSc: 0-No pain                 Jarely Juncaj

## 2018-11-10 NOTE — Anesthesia Preprocedure Evaluation (Signed)
Anesthesia Evaluation  Patient identified by MRN, date of birth, ID band Patient awake    Reviewed: Allergy & Precautions, NPO status , Patient's Chart, lab work & pertinent test results  Airway Mallampati: II  TM Distance: >3 FB Neck ROM: Full    Dental no notable dental hx. (+) Teeth Intact   Pulmonary asthma , COPD,  COPD inhaler, Current Smoker,    Pulmonary exam normal breath sounds clear to auscultation       Cardiovascular Exercise Tolerance: Good negative cardio ROS Normal cardiovascular examI Rhythm:Regular Rate:Normal     Neuro/Psych Depression negative neurological ROS  negative psych ROS   GI/Hepatic negative GI ROS, Neg liver ROS,   Endo/Other  negative endocrine ROSdiabetes, Well Controlled, Type 1  Renal/GU negative Renal ROS  negative genitourinary   Musculoskeletal negative musculoskeletal ROS (+)   Abdominal   Peds negative pediatric ROS (+)  Hematology negative hematology ROS (+)   Anesthesia Other Findings   Reproductive/Obstetrics negative OB ROS                             Anesthesia Physical Anesthesia Plan  ASA: III  Anesthesia Plan: General and Regional   Post-op Pain Management:  Regional for Post-op pain   Induction: Intravenous  PONV Risk Score and Plan:   Airway Management Planned: Oral ETT  Additional Equipment:   Intra-op Plan:   Post-operative Plan: Extubation in OR  Informed Consent:   Plan Discussed with:   Anesthesia Plan Comments: (D/w Pt and Dr. Lemmie Evens. -WTP with L ISB for POPM -PSR  )        Anesthesia Quick Evaluation

## 2018-11-10 NOTE — Transfer of Care (Signed)
Immediate Anesthesia Transfer of Care Note  Patient: Stephanie Khan  Procedure(s) Performed: OPEN REDUCTION INTERNAL FIXATION (ORIF) HUMERAL SHAFT FRACTURE (Left Shoulder)  Patient Location: PACU  Anesthesia Type:General  Level of Consciousness: awake, alert  and patient cooperative  Airway & Oxygen Therapy: Patient Spontanous Breathing  Post-op Assessment: Report given to RN and Post -op Vital signs reviewed and stable  Post vital signs: Reviewed and stable  Last Vitals:  Vitals Value Taken Time  BP 115/65 11/10/2018  1:00 PM  Temp    Pulse 79 11/10/2018  1:02 PM  Resp 23 11/10/2018  1:02 PM  SpO2 100 % 11/10/2018  1:02 PM  Vitals shown include unvalidated device data.  Last Pain:  Vitals:   11/10/18 0947  TempSrc: Oral  PainSc: 0-No pain      Patients Stated Pain Goal: 9 (15/05/69 7948)  Complications: No apparent anesthesia complications

## 2018-11-10 NOTE — Brief Op Note (Signed)
11/10/2018  12:55 PM  PATIENT:  Stephanie Khan  58 y.o. female  PRE-OPERATIVE DIAGNOSIS:  fracture left humeral shaft  POST-OPERATIVE DIAGNOSIS:  fracture left humeral shaft  PROCEDURE:  Procedure(s): OPEN REDUCTION INTERNAL FIXATION (ORIF) HUMERAL SHAFT FRACTURE (Left)   synthes proximal humeral plate - 10 holes   SURGEON:  Surgeon(s) and Role:    Carole Civil, MD - Primary  PHYSICIAN ASSISTANT:   ASSISTANTS: betty ashley    ANESTHESIA:   general and paracervical block  EBL:  150 mL   BLOOD ADMINISTERED:none  DRAINS: none   LOCAL MEDICATIONS USED:  MARCAINE     SPECIMEN:  No Specimen  DISPOSITION OF SPECIMEN:  N/A  COUNTS:  YES  TOURNIQUET:  * No tourniquets in log *  DICTATION: .Dragon Dictation  PLAN OF CARE: Admit for overnight observation  PATIENT DISPOSITION:  PACU - hemodynamically stable.   Delay start of Pharmacological VTE agent (>24hrs) due to surgical blood loss or risk of bleeding: not applicable

## 2018-11-10 NOTE — Op Note (Signed)
11/10/2018  1:39 PM  PATIENT:  Stephanie Khan  58 y.o. female  PRE-OPERATIVE DIAGNOSIS:  fracture left humeral shaft  POST-OPERATIVE DIAGNOSIS:  fracture left humeral shaft  PROCEDURE:  Procedure(s): OPEN REDUCTION INTERNAL FIXATION (ORIF) HUMERAL SHAFT FRACTURE (Left)  SURGEON:  Surgeon(s) and Role:    Carole Civil, MD - Primary  The patient was identified in the preop area the left arm was marked for surgery chart review was completed patient confirmed site of surgery patient taken to surgery patient given antibiotics 2 g IV Ancef patient underwent anesthesia by intubation and she also had a preop interscalene block.  Timeout  After sterile prep and drape a deltopectoral and anterolateral approach were performed to expose the proximal humerus and the shaft of the humerus.  There was abundant fracture callus and fibrous union of the bone especially noted was the complete healing of the proximal portion of the fracture and the fibrous union of the mid and distal portion of the fracture.  After exposing the deltopectoral interval and preserving the cephalic vein in the deltopectoral interval explored and the proximal shaft was exposed.  We then turned our attention to the distalmost portion of the fracture we had to take down fracture callus.  We went between the biceps and brachialis and then split the brachialis medial two thirds lateral one third down to bone exposing the fracture.  We did a provisional reduction with the C arm guidance.  We then exposed the fracture site further and debrided it and then once this was done we were able to reduce the fracture with a [place] foot clamp and a large bone clamp  Nerves were exposed including the musculocutaneous nerve underneath the biceps and the radial nerve between the brachial radialis and the brachialis.  These nerves were protected throughout the surgery  We placed the proximal locking screws first and confirmed position with  x-ray we then released some of the deltoid and passed the plate underneath the deltoid.  We use a large bone-holding clamp to reduce the distal portion of the fracture.  We then placed nonlocking screws distally.  Final radiographs confirmed reduction  Wound was irrigated and closed with 0 Monocryl and 2-0 Monocryl.  Sterile dressings were applied patient was placed in a sling  Postoperative plan The patient can start immediate passive range of motion shoulder active range of motion elbow and hand, Dressing change at 2 weeks staples out in 2 weeks  PHYSICIAN ASSISTANT:   ASSISTANTS: betty ashley    ANESTHESIA:   general and paracervical block  EBL:  150 mL   BLOOD ADMINISTERED:none  DRAINS: none   LOCAL MEDICATIONS USED:  MARCAINE     SPECIMEN:  No Specimen  DISPOSITION OF SPECIMEN:  N/A  COUNTS:  YES  TOURNIQUET:  * No tourniquets in log *  DICTATION: .Dragon Dictation  PLAN OF CARE: Admit for overnight observation  PATIENT DISPOSITION:  PACU - hemodynamically stable.   Delay start of Pharmacological VTE agent (>24hrs) due to surgical blood loss or risk of bleeding: not applicable

## 2018-11-10 NOTE — Anesthesia Procedure Notes (Signed)
Procedure Name: Intubation Date/Time: 11/10/2018 10:40 AM Performed by: Vista Deck, CRNA Pre-anesthesia Checklist: Patient identified, Patient being monitored, Timeout performed, Emergency Drugs available and Suction available Patient Re-evaluated:Patient Re-evaluated prior to induction Oxygen Delivery Method: Circle System Utilized Preoxygenation: Pre-oxygenation with 100% oxygen Induction Type: IV induction Ventilation: Mask ventilation without difficulty Laryngoscope Size: Miller and 2 Grade View: Grade I Tube type: Oral Tube size: 7.0 mm Number of attempts: 1 Airway Equipment and Method: Stylet Placement Confirmation: ETT inserted through vocal cords under direct vision,  positive ETCO2 and breath sounds checked- equal and bilateral Secured at: 21 cm Tube secured with: Tape Dental Injury: Teeth and Oropharynx as per pre-operative assessment

## 2018-11-11 ENCOUNTER — Encounter (HOSPITAL_COMMUNITY): Payer: Self-pay | Admitting: Orthopedic Surgery

## 2018-11-11 DIAGNOSIS — S42352K Displaced comminuted fracture of shaft of humerus, left arm, subsequent encounter for fracture with nonunion: Secondary | ICD-10-CM | POA: Diagnosis not present

## 2018-11-11 LAB — GLUCOSE, CAPILLARY
Glucose-Capillary: 182 mg/dL — ABNORMAL HIGH (ref 70–99)
Glucose-Capillary: 192 mg/dL — ABNORMAL HIGH (ref 70–99)

## 2018-11-11 NOTE — Anesthesia Postprocedure Evaluation (Signed)
Anesthesia Post Note  Patient: Stephanie Khan  Procedure(s) Performed: OPEN REDUCTION INTERNAL FIXATION (ORIF) HUMERAL SHAFT FRACTURE (Left Shoulder)  Patient location during evaluation: Nursing Unit Anesthesia Type: Regional Level of consciousness: awake and alert Pain management: pain level controlled Vital Signs Assessment: post-procedure vital signs reviewed and stable Respiratory status: spontaneous breathing, nonlabored ventilation and respiratory function stable Cardiovascular status: blood pressure returned to baseline Postop Assessment: no apparent nausea or vomiting Anesthetic complications: no     Last Vitals:  Vitals:   11/11/18 0357 11/11/18 0357  BP: (!) 120/57 (!) 120/57  Pulse: 79 69  Resp: 18 18  Temp: 36.9 C 36.9 C  SpO2: 97% 97%    Last Pain:  Vitals:   11/11/18 0914  TempSrc:   PainSc: 9                  Americo Vallery J

## 2018-11-11 NOTE — Discharge Summary (Signed)
Physician Discharge Summary  Patient ID: Stephanie Khan MRN: [id number] DOB/AGE: 1961/06/21 58 y.o.  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  She underwent open treatment internal fixation with Synthes proximal humeral plate and screws.  She was kept overnight for observation.  She is doing well with pain left humerus expected does not have any neurovascular deficit specifically radial nerve function intact with wrist extension thumb extension and MCP joint extension intact  Blood pressure (!) 120/57, pulse 69, temperature 98.4 F (36.9 C), temperature source Oral, resp. rate 18, height 5\' 4"  (1.626 m), weight 94.8 kg, SpO2 97 %.   Disposition: Discharge disposition: 01-Home or Self Care       Discharge Instructions    Call MD / Call 911   Complete by:  As directed    If you experience chest pain or shortness of breath, CALL 911 and be transported to the hospital emergency room.  If you develope a fever above 101 F, pus (white drainage) or increased drainage or redness at the wound, or calf pain, call your surgeon's office.   Constipation Prevention   Complete by:  As directed    Drink plenty of fluids.  Prune juice may be helpful.  You may use a stool softener, such as Colace (over the counter) 100 mg twice a day.  Use MiraLax (over the counter) for constipation as needed.   Diet - low sodium heart healthy   Complete by:  As directed    Discharge instructions   Complete by:  As directed    Remove dressing on 2/24, cover with gauze and tape  No shower for 14 days   Wear sling all the time  except: at meal time remove sling and straighten your arm. Bend and straighten your elbow your elbow 10 x   Open and close the hand 10 x every hour   Increase activity slowly as tolerated   Complete by:  As directed      Allergies as of 11/11/2018   No Known Allergies     Medication List    TAKE these medications   acidophilus Caps capsule Take 1 capsule by mouth daily.   aspirin EC 81 MG tablet Take 1 tablet (81 mg total) by mouth daily.   CALCIUM 600 PO Take 1 tablet by mouth daily.   clonazePAM 0.5 MG tablet Commonly known as:  KLONOPIN Take 0.5 mg by mouth 2 (two) times daily as needed for anxiety.   ibuprofen 600 MG tablet Commonly known as:  ADVIL,MOTRIN Take 1 tablet (600 mg total) by mouth every 6 (six) hours as needed.   LANTUS SOLOSTAR 100 UNIT/ML Solostar Pen Generic drug:  Insulin Glargine Inject 22 Units into the skin every morning.   loratadine 10 MG tablet Commonly known as:  CLARITIN Take 1 tablet by mouth daily as needed for allergies.   metFORMIN 1000 MG tablet Commonly known as:  GLUCOPHAGE Take 1,000 mg by mouth 2 (two) times daily with a meal.   methocarbamol 500 MG tablet Commonly known as:  ROBAXIN Take 500 mg by mouth at bedtime as needed for muscle spasms.   oxyCODONE-acetaminophen 10-325 MG tablet Commonly  known as:  PERCOCET Take 1 tablet by mouth every 4 (four) hours as needed for pain.      Follow-up Information    Carole Civil, MD Follow up in 2 week(s).   Specialties:  Orthopedic Surgery, Radiology Contact information: [address] [place] [address] [phone]           Signed: Arther Abbott 11/11/2018, 9:37 AM

## 2018-11-11 NOTE — Progress Notes (Signed)
Patient discharged home today per MD orders. Patient vital signs WDL. IV removed and site WDL. Discharge Instructions including follow up appointments, medications, and education reviewed with patient. Patient verbalizes understanding. Patient is transported out via wheelchair.  

## 2018-11-11 NOTE — Addendum Note (Signed)
Addendum  created 11/11/18 0916 by Charmaine Downs, CRNA   Clinical Note Signed

## 2018-11-24 DIAGNOSIS — Z8781 Personal history of (healed) traumatic fracture: Principal | ICD-10-CM

## 2018-11-24 DIAGNOSIS — Z9889 Other specified postprocedural states: Secondary | ICD-10-CM | POA: Insufficient documentation

## 2018-11-25 ENCOUNTER — Other Ambulatory Visit: Payer: Self-pay

## 2018-11-25 ENCOUNTER — Ambulatory Visit (INDEPENDENT_AMBULATORY_CARE_PROVIDER_SITE_OTHER): Payer: BLUE CROSS/BLUE SHIELD

## 2018-11-25 ENCOUNTER — Ambulatory Visit (INDEPENDENT_AMBULATORY_CARE_PROVIDER_SITE_OTHER): Payer: BLUE CROSS/BLUE SHIELD | Admitting: Orthopedic Surgery

## 2018-11-25 ENCOUNTER — Encounter: Payer: Self-pay | Admitting: Orthopedic Surgery

## 2018-11-25 DIAGNOSIS — Z8781 Personal history of (healed) traumatic fracture: Secondary | ICD-10-CM | POA: Diagnosis not present

## 2018-11-25 DIAGNOSIS — Z9889 Other specified postprocedural states: Secondary | ICD-10-CM

## 2018-11-25 NOTE — Progress Notes (Signed)
POSTOP VISIT  POD # 15  Chief Complaint  Patient presents with  . Routine Post Op    Left humerus DOS 11/10/18    11/10/2018   1:39 PM   PATIENT:  Stephanie Khan  58 y.o. female   PRE-OPERATIVE DIAGNOSIS:  fracture left humeral shaft   POST-OPERATIVE DIAGNOSIS:  fracture left humeral shaft   PROCEDURE:  Procedure(s): OPEN REDUCTION INTERNAL FIXATION (ORIF) HUMERAL SHAFT FRACTURE (Left)  - SYNTHES   SURGEON:  Surgeon(s) and Role:    Carole Civil, MD - Primary   Encounter Diagnosis  Name Primary?  . S/P ORIF (open reduction internal fixation) fracture left humerus 11/10/18    PAIN MED:   WOUND CLEAN   RADIAL NERVE FUNCTION INTACT   Postoperative plan (Work, WB, No orders of the defined types were placed in this encounter. ,FU)  FRACTURE CUFF   XRAYS IN 4 WEEKS   HAND EXERCISES   OOW 12 WEEKS

## 2018-12-26 ENCOUNTER — Other Ambulatory Visit: Payer: Self-pay

## 2018-12-26 ENCOUNTER — Ambulatory Visit (INDEPENDENT_AMBULATORY_CARE_PROVIDER_SITE_OTHER): Payer: BLUE CROSS/BLUE SHIELD | Admitting: Orthopedic Surgery

## 2018-12-26 ENCOUNTER — Ambulatory Visit (INDEPENDENT_AMBULATORY_CARE_PROVIDER_SITE_OTHER): Payer: BLUE CROSS/BLUE SHIELD

## 2018-12-26 ENCOUNTER — Encounter: Payer: Self-pay | Admitting: Orthopedic Surgery

## 2018-12-26 VITALS — Temp 97.7°F | Ht 64.0 in | Wt 198.0 lb

## 2018-12-26 DIAGNOSIS — Z8781 Personal history of (healed) traumatic fracture: Secondary | ICD-10-CM | POA: Diagnosis not present

## 2018-12-26 DIAGNOSIS — Z9889 Other specified postprocedural states: Secondary | ICD-10-CM

## 2018-12-26 NOTE — Patient Instructions (Signed)
Start home exercises as instructed on the exercise sheet  Return to work note for April 20

## 2018-12-26 NOTE — Progress Notes (Signed)
POSTOP VISIT  POD # 73  Chief Complaint  Patient presents with  . 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  Encounter Diagnosis  Name Primary?  . S/P ORIF (open reduction internal fixation) fracture left humerus 11/10/18 Yes    Postoperative plan (Work, WB, No orders of the defined types were placed in this encounter. 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

## 2019-02-06 ENCOUNTER — Other Ambulatory Visit: Payer: Self-pay

## 2019-02-06 ENCOUNTER — Ambulatory Visit (INDEPENDENT_AMBULATORY_CARE_PROVIDER_SITE_OTHER): Payer: BLUE CROSS/BLUE SHIELD

## 2019-02-06 ENCOUNTER — Ambulatory Visit (INDEPENDENT_AMBULATORY_CARE_PROVIDER_SITE_OTHER): Payer: BLUE CROSS/BLUE SHIELD | Admitting: Orthopedic Surgery

## 2019-02-06 VITALS — Temp 97.3°F | Ht 64.0 in | Wt 198.0 lb

## 2019-02-06 DIAGNOSIS — Z8781 Personal history of (healed) traumatic fracture: Secondary | ICD-10-CM

## 2019-02-06 DIAGNOSIS — Z9889 Other specified postprocedural states: Secondary | ICD-10-CM | POA: Diagnosis not present

## 2019-02-06 NOTE — Patient Instructions (Signed)
Exercises for the shoulder

## 2019-02-06 NOTE — Progress Notes (Signed)
Patient ID: Stephanie Khan, female   DOB: 1961-08-12, 58 y.o.   MRN: [id number]  FRACTURE CARE   Chief Complaint  Patient presents with  . Routine Post Op    DOS 11/10/18    Encounter Diagnosis  Name Primary?  . S/P ORIF (open reduction internal fixation) fracture left humerus 11/10/18 Yes    CURRENT TREATMENT : s/p orif, arom exercises at home   [place]  C/o stiffness in the left shoulder  Her skin incision healed nicely her radial nerve function is intact  The x-ray shows that the fracture is healing if not completely healed  Recommend Cane assisted flexion active range of motion with finger walking on the wall  X-ray 3 months

## 2019-04-17 ENCOUNTER — Encounter: Payer: Self-pay | Admitting: Orthopedic Surgery

## 2019-05-10 ENCOUNTER — Ambulatory Visit: Payer: BLUE CROSS/BLUE SHIELD | Admitting: Orthopedic Surgery

## 2019-05-24 ENCOUNTER — Ambulatory Visit: Payer: BLUE CROSS/BLUE SHIELD

## 2019-05-24 ENCOUNTER — Other Ambulatory Visit: Payer: Self-pay

## 2019-05-24 ENCOUNTER — Ambulatory Visit: Payer: BLUE CROSS/BLUE SHIELD | Admitting: Orthopedic Surgery

## 2019-05-24 VITALS — BP 124/67 | HR 77 | Temp 96.9°F | Ht 64.0 in | Wt 199.0 lb

## 2019-05-24 DIAGNOSIS — Z8781 Personal history of (healed) traumatic fracture: Secondary | ICD-10-CM | POA: Diagnosis not present

## 2019-05-24 DIAGNOSIS — Z9889 Other specified postprocedural states: Secondary | ICD-10-CM | POA: Diagnosis not present

## 2019-05-24 DIAGNOSIS — S42352K Displaced comminuted fracture of shaft of humerus, left arm, subsequent encounter for fracture with nonunion: Secondary | ICD-10-CM | POA: Diagnosis not present

## 2019-05-24 NOTE — Progress Notes (Signed)
Chief Complaint  Patient presents with  . Follow-up    Recheck om left humerus fracture, DOS 11-10-18.   6 months post open treatment internal fixation for a malaligned nonunion of the humerus.  She has intermittent discomfort  Review of systems denies numbness or tingling in the left arm  Exam shows a well-developed well-nourished female alert and oriented x3 mood and affect normal gait is normal  Incisions are well-healed patient has 130 degrees of abduction and flexion no tenderness at the fracture site radial nerve function intact gross motor strength and rotator cuff normal no shoulder instability no elbow instability  Encounter Diagnoses  Name Primary?  . S/P ORIF (open reduction internal fixation) fracture left humerus 11/10/18 Yes  . 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

## 2020-01-01 ENCOUNTER — Telehealth: Payer: Self-pay | Admitting: Adult Health

## 2020-01-01 NOTE — Telephone Encounter (Signed)

## 2020-01-02 ENCOUNTER — Other Ambulatory Visit: Payer: Self-pay

## 2020-01-02 ENCOUNTER — Encounter: Payer: Self-pay | Admitting: Adult Health

## 2020-01-02 ENCOUNTER — Ambulatory Visit (INDEPENDENT_AMBULATORY_CARE_PROVIDER_SITE_OTHER): Payer: BLUE CROSS/BLUE SHIELD | Admitting: Adult Health

## 2020-01-02 ENCOUNTER — Inpatient Hospital Stay (HOSPITAL_COMMUNITY): Admit: 2020-01-02 | Payer: BLUE CROSS/BLUE SHIELD

## 2020-01-02 VITALS — BP 115/74 | HR 80 | Ht 64.0 in | Wt 193.4 lb

## 2020-01-02 DIAGNOSIS — N9089 Other specified noninflammatory disorders of vulva and perineum: Secondary | ICD-10-CM

## 2020-01-02 DIAGNOSIS — Z1212 Encounter for screening for malignant neoplasm of rectum: Secondary | ICD-10-CM | POA: Diagnosis not present

## 2020-01-02 DIAGNOSIS — Z1211 Encounter for screening for malignant neoplasm of colon: Secondary | ICD-10-CM | POA: Insufficient documentation

## 2020-01-02 DIAGNOSIS — Z01419 Encounter for gynecological examination (general) (routine) without abnormal findings: Secondary | ICD-10-CM

## 2020-01-02 DIAGNOSIS — Z1272 Encounter for screening for malignant neoplasm of vagina: Secondary | ICD-10-CM

## 2020-01-02 DIAGNOSIS — L292 Pruritus vulvae: Secondary | ICD-10-CM

## 2020-01-02 LAB — HEMOCCULT GUIAC POC 1CARD (OFFICE): Fecal Occult Blood, POC: NEGATIVE

## 2020-01-02 MED ORDER — NYSTATIN-TRIAMCINOLONE 100000-0.1 UNIT/GM-% EX OINT: 1.0000 "application " | TOPICAL_OINTMENT | Freq: Two times a day (BID) | CUTANEOUS | 3 refills | Status: DC

## 2020-01-02 NOTE — Addendum Note (Signed)
Addended by: Armond Hang on: 01/02/2020 03:28 PM   Modules accepted: Orders

## 2020-01-02 NOTE — Progress Notes (Signed)
Patient ID: Stephanie Khan, female   DOB: [birth year], 59 y.o.   MRN: [id number] History of Present Illness: Stephanie Khan is a 59 year old white female, single, PM in for a well woman gyn exam and pap. 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  PCP is Dr Sherrie Sport.   Current Medications, Allergies, Past Medical History, Past Surgical History, Family History and Social History were reviewed in [employer] record.     Review of Systems:  Patient denies any headaches, fatigue, blurred vision, shortness of breath, chest pain, abdominal pain, problems with bowel movements, urination, or intercourse(does not have often). No joint pain or mood swings. She is HOH. No vaginal bleeding. Has vulva itching esp at night, is on new pill for diabetes she says, and that is when it started   Physical Exam:BP 115/74 (BP Location: Right Arm, Patient Position: Sitting, Cuff Size: Normal)   Pulse 80   Ht 5\' 4"  (1.626 m)   Wt 193 lb 6.4 oz (87.7 kg)   BMI 33.20 kg/m  General:  Well developed, well nourished, no acute distress Skin:  Warm and dry Neck:  Midline trachea, normal thyroid, good ROM, no lymphadenopathy Lungs; Clear to auscultation bilaterally Breast:  No dominant palpable mass, retraction, or nipple discharge Cardiovascular: Regular rate and rhythm Abdomen:  Soft, non tender, no hepatosplenomegaly Pelvic:  External genitalia is red and swollen.  The vagina has loss of color, moisture and rugae.Marland Kitchen Urethra has no lesions or masses. The cervix is smooth, and pap with high risk HPV performed.  Uterus is felt to be normal size, shape, and contour.  No adnexal masses or tenderness noted.Bladder is non tender, no masses felt. Rectal: Good sphincter tone, no polyps, or hemorrhoids felt.  Hemoccult negative. Extremities/musculoskeletal:  No swelling or varicosities noted, no clubbing or cyanosis Psych:  No mood changes, alert and cooperative,seems happy AA is 1 Fall risk is low PHQ 9 score is  0. Examination chaperoned by Levy Pupa LPN   Impression and Plan: 1. Encounter for gynecological examination with Papanicolaou smear of cervix Pap sent  Physical in 1 year Pap in 3 if normal  2. Screening for colorectal cancer Colonoscopy per GI  3. Vulvar irritation Will rx mytrex cream  4. Vulvar itching Will rx mytrex cream  Meds ordered this encounter  Medications  . nystatin-triamcinolone ointment (MYCOLOG)    Sig: Apply 1 application topically 2 (two) times daily.    Dispense:  30 g    Refill:  3    Order Specific Question:   Supervising Provider    Answer:   Florian Buff [[id number]]  Let Dr Sherrie Sport know, probably related to [facility]

## 2020-01-04 LAB — CYTOLOGY - PAP
Adequacy: ABSENT
Comment: NEGATIVE
Diagnosis: NEGATIVE
High risk HPV: NEGATIVE

## 2021-02-06 ENCOUNTER — Encounter (INDEPENDENT_AMBULATORY_CARE_PROVIDER_SITE_OTHER): Payer: Self-pay | Admitting: *Deleted

## 2022-03-05 DIAGNOSIS — R69 Illness, unspecified: Secondary | ICD-10-CM | POA: Diagnosis not present

## 2022-03-05 DIAGNOSIS — M25519 Pain in unspecified shoulder: Secondary | ICD-10-CM | POA: Diagnosis not present

## 2022-03-05 DIAGNOSIS — Z79891 Long term (current) use of opiate analgesic: Secondary | ICD-10-CM | POA: Diagnosis not present

## 2022-03-05 DIAGNOSIS — M545 Low back pain, unspecified: Secondary | ICD-10-CM | POA: Diagnosis not present

## 2022-03-05 DIAGNOSIS — M542 Cervicalgia: Secondary | ICD-10-CM | POA: Diagnosis not present

## 2022-03-05 DIAGNOSIS — M25512 Pain in left shoulder: Secondary | ICD-10-CM | POA: Diagnosis not present

## 2022-03-05 DIAGNOSIS — M79622 Pain in left upper arm: Secondary | ICD-10-CM | POA: Diagnosis not present

## 2022-03-17 DIAGNOSIS — R1030 Lower abdominal pain, unspecified: Secondary | ICD-10-CM | POA: Diagnosis not present

## 2022-03-17 DIAGNOSIS — E7849 Other hyperlipidemia: Secondary | ICD-10-CM | POA: Diagnosis not present

## 2022-03-17 DIAGNOSIS — Z6834 Body mass index (BMI) 34.0-34.9, adult: Secondary | ICD-10-CM | POA: Diagnosis not present

## 2022-03-17 DIAGNOSIS — I10 Essential (primary) hypertension: Secondary | ICD-10-CM | POA: Diagnosis not present

## 2022-03-17 DIAGNOSIS — K219 Gastro-esophageal reflux disease without esophagitis: Secondary | ICD-10-CM | POA: Diagnosis not present

## 2022-03-17 DIAGNOSIS — R5383 Other fatigue: Secondary | ICD-10-CM | POA: Diagnosis not present

## 2022-03-17 DIAGNOSIS — E1169 Type 2 diabetes mellitus with other specified complication: Secondary | ICD-10-CM | POA: Diagnosis not present

## 2022-03-18 ENCOUNTER — Other Ambulatory Visit: Payer: Self-pay | Admitting: Internal Medicine

## 2022-03-18 ENCOUNTER — Other Ambulatory Visit (HOSPITAL_COMMUNITY): Payer: Self-pay | Admitting: Internal Medicine

## 2022-03-18 DIAGNOSIS — R1030 Lower abdominal pain, unspecified: Secondary | ICD-10-CM

## 2022-04-07 ENCOUNTER — Ambulatory Visit (HOSPITAL_COMMUNITY)
Admission: RE | Admit: 2022-04-07 | Discharge: 2022-04-07 | Disposition: A | Discharge status: Home / Self Care | Payer: 59 | Source: Ambulatory Visit | Attending: Internal Medicine | Admitting: Internal Medicine

## 2022-04-07 DIAGNOSIS — R1030 Lower abdominal pain, unspecified: Secondary | ICD-10-CM

## 2022-05-06 ENCOUNTER — Other Ambulatory Visit (HOSPITAL_COMMUNITY)
Admission: RE | Admit: 2022-05-06 | Discharge: 2022-05-06 | Disposition: A | Discharge status: Home / Self Care | Payer: 59 | Source: Ambulatory Visit | Attending: Adult Health | Admitting: Adult Health

## 2022-05-06 ENCOUNTER — Encounter: Payer: Self-pay | Admitting: Adult Health

## 2022-05-06 ENCOUNTER — Ambulatory Visit (INDEPENDENT_AMBULATORY_CARE_PROVIDER_SITE_OTHER): Payer: 59 | Admitting: Adult Health

## 2022-05-06 VITALS — BP 125/78 | HR 75 | Ht 64.0 in | Wt 199.5 lb

## 2022-05-06 DIAGNOSIS — Z1211 Encounter for screening for malignant neoplasm of colon: Secondary | ICD-10-CM

## 2022-05-06 DIAGNOSIS — R195 Other fecal abnormalities: Secondary | ICD-10-CM | POA: Diagnosis not present

## 2022-05-06 DIAGNOSIS — H9190 Unspecified hearing loss, unspecified ear: Secondary | ICD-10-CM

## 2022-05-06 DIAGNOSIS — N816 Rectocele: Secondary | ICD-10-CM

## 2022-05-06 DIAGNOSIS — Z01419 Encounter for gynecological examination (general) (routine) without abnormal findings: Secondary | ICD-10-CM

## 2022-05-06 DIAGNOSIS — Z1231 Encounter for screening mammogram for malignant neoplasm of breast: Secondary | ICD-10-CM | POA: Insufficient documentation

## 2022-05-06 DIAGNOSIS — N907 Vulvar cyst: Secondary | ICD-10-CM

## 2022-05-06 DIAGNOSIS — R309 Painful micturition, unspecified: Secondary | ICD-10-CM

## 2022-05-06 LAB — POCT URINALYSIS DIPSTICK
Glucose, UA: POSITIVE — AB
Ketones, UA: NEGATIVE
Leukocytes, UA: NEGATIVE
Nitrite, UA: NEGATIVE
Protein, UA: NEGATIVE

## 2022-05-06 NOTE — Progress Notes (Signed)
Patient ID: Stephanie Khan, female   DOB: December 14, 1960, 61 y.o.   MRN: [id number] History of Present Illness: Stephanie Khan is a 61 year old white female, widowed, PM in for a well woman gyn exam and wants a pap. She is complaining of pain at end of urine stream for 3-4 months now. She has seen PCP and urologists and had [place] had small fibroids. Has noticed ridges/bulge in vagina, and ?knot left labia. She is HOH. PCP is Dr Sherrie Sport.    Current Medications, Allergies, Past Medical History, Past Surgical History, Family History and Social History were reviewed in [employer] record.     Review of Systems: Patient denies any headaches,  fatigue, blurred vision, shortness of breath, chest pain, abdominal pain, problems with bowel movements, or intercourse.(Not active).  No joint pain or mood swings.  See HPI for positives.   Physical Exam:BP 125/78 (BP Location: Right Arm, Patient Position: Sitting, Cuff Size: Normal)   Pulse 75   Ht '5\' 4"'$  (1.626 m)   Wt 199 lb 8 oz (90.5 kg)   BMI 34.24 kg/m  urine dipstick trace blood and 4+glucose General:  Well developed, well nourished, no acute distress Skin:  Warm and dry Neck:  Midline trachea, normal thyroid, good ROM, no lymphadenopathy,no carotid bruits heard  Lungs; Clear to auscultation bilaterally Breast:  No dominant palpable mass, retraction, or nipple discharge Cardiovascular: Regular rate and rhythm Abdomen:  Soft, non tender, no hepatosplenomegaly Pelvic:  External genitalia is normal in appearance, has epidermal cyst left inner labia.   The vagina is pale with loss of moisture and rugae. Urethra has no lesions or masses. The cervix is smooth, pap with HR HPV genotyping performed.  Uterus is felt to be normal size, shape, and contour.  No adnexal masses or tenderness noted.Bladder is non tender, no masses felt. Rectal: Good sphincter tone, no polyps, or hemorrhoids felt.  Hemoccult positive,  +rectocele Extremities/musculoskeletal:  No swelling, +varicosities noted, no clubbing or cyanosis Psych:  No mood changes, alert and cooperative,seems happy AA is 1 Fall risk is low    05/06/2022    3:47 PM 01/02/2020    2:58 PM  Depression screen PHQ 2/9  Decreased Interest 0 0  Down, Depressed, Hopeless 0 0  PHQ - 2 Score 0 0  Altered sleeping 0 0  Tired, decreased energy 0 0  Change in appetite 0 0  Feeling bad or failure about yourself  0 0  Trouble concentrating 0 0  Moving slowly or fidgety/restless 0 0  Suicidal thoughts 0 0  PHQ-9 Score 0 0  Difficult doing work/chores  Not difficult at all       05/06/2022    3:47 PM 01/02/2020    2:58 PM  GAD 7 : Generalized Anxiety Score  Nervous, Anxious, on Edge 0 0  Control/stop worrying 0 0  Worry too much - different things 0 0  Trouble relaxing 0 0  Restless 0 0  Easily annoyed or irritable 0 0  Afraid - awful might happen 0 0  Total GAD 7 Score 0 0  Anxiety Difficulty  Not difficult at all      Upstream - 05/06/22 1557       Pregnancy Intention Screening   Does the patient want to become pregnant in the next year? No    Does the patient's partner want to become pregnant in the next year? No    Would the patient like to discuss contraceptive options today? No  Contraception Wrap Up   Current Method Female Sterilization    End Method Female Sterilization    Contraception Counseling Provided No             Examination chaperoned by Levy Pupa LPN  Impression and Plan: 1. Encounter for gynecological examination with Papanicolaou smear of cervix Pap sent Pap in 3 years if normal Physical in 1 year Labs with PCP Colonoscopy per GI  - Cytology - PAP( [facility]  2. Encounter for screening fecal occult blood testing Hemoccult was positive  3. Positive fecal occult blood test 3 hemoccult card sent home with her to do   4. Rectocele Given medical explainer #4   5. Epidermal cyst of vulva Will  follow for now, if she decides she wants excised, will call  6. Hearing loss, unspecified hearing loss type, unspecified laterality  7. Screening mammogram for breast cancer Mammogram scheduled for her at [facility] 06/15/22 at 4 pm (she wanted end of September) - MM 3D SCREEN BREAST BILATERAL; Future  8. Pain with urination Will send urine for UA C&S  - POCT Urinalysis Dipstick - Urine Culture - Urinalysis, Routine w reflex microscopic

## 2022-05-07 LAB — MICROSCOPIC EXAMINATION
Casts: NONE SEEN /lpf
WBC, UA: >30 /hpf — AB (ref 0–5)

## 2022-05-07 LAB — URINALYSIS, ROUTINE W REFLEX MICROSCOPIC
Bilirubin, UA: NEGATIVE
Ketones, UA: NEGATIVE
Leukocytes,UA: NEGATIVE
Nitrite, UA: NEGATIVE
Protein,UA: NEGATIVE
Specific Gravity, UA: 1.029 (ref 1.005–1.030)
Urobilinogen, Ur: 0.2 mg/dL (ref 0.2–1.0)
pH, UA: 6 (ref 5.0–7.5)

## 2022-05-11 LAB — CYTOLOGY - PAP
Adequacy: ABSENT
Comment: NEGATIVE
Diagnosis: NEGATIVE
High risk HPV: NEGATIVE

## 2022-05-12 ENCOUNTER — Telehealth: Payer: Self-pay | Admitting: Adult Health

## 2022-05-12 LAB — URINE CULTURE

## 2022-05-12 MED ORDER — SULFAMETHOXAZOLE-TRIMETHOPRIM 800-160 MG PO TABS: 1.0000 | ORAL_TABLET | Freq: Two times a day (BID) | ORAL | 0 refills | Status: DC

## 2022-05-12 NOTE — Telephone Encounter (Signed)
Left message that has + klebsiella on culture sent antibiotic in to [facility], septra ds and pap was negative for HPV and malignancy

## 2022-05-28 DIAGNOSIS — M79622 Pain in left upper arm: Secondary | ICD-10-CM | POA: Diagnosis not present

## 2022-05-28 DIAGNOSIS — R69 Illness, unspecified: Secondary | ICD-10-CM | POA: Diagnosis not present

## 2022-05-28 DIAGNOSIS — Z79891 Long term (current) use of opiate analgesic: Secondary | ICD-10-CM | POA: Diagnosis not present

## 2022-05-28 DIAGNOSIS — M545 Low back pain, unspecified: Secondary | ICD-10-CM | POA: Diagnosis not present

## 2022-05-28 DIAGNOSIS — M542 Cervicalgia: Secondary | ICD-10-CM | POA: Diagnosis not present

## 2022-05-28 DIAGNOSIS — M25512 Pain in left shoulder: Secondary | ICD-10-CM | POA: Diagnosis not present

## 2022-06-15 ENCOUNTER — Ambulatory Visit (HOSPITAL_COMMUNITY)
Admission: RE | Admit: 2022-06-15 | Discharge: 2022-06-15 | Disposition: A | Discharge status: Home / Self Care | Payer: 59 | Source: Ambulatory Visit | Attending: Adult Health | Admitting: Adult Health

## 2022-06-15 DIAGNOSIS — Z1231 Encounter for screening mammogram for malignant neoplasm of breast: Secondary | ICD-10-CM | POA: Insufficient documentation

## 2022-06-29 DIAGNOSIS — K219 Gastro-esophageal reflux disease without esophagitis: Secondary | ICD-10-CM | POA: Diagnosis not present

## 2022-06-29 DIAGNOSIS — R5383 Other fatigue: Secondary | ICD-10-CM | POA: Diagnosis not present

## 2022-06-29 DIAGNOSIS — R1030 Lower abdominal pain, unspecified: Secondary | ICD-10-CM | POA: Diagnosis not present

## 2022-06-29 DIAGNOSIS — E1169 Type 2 diabetes mellitus with other specified complication: Secondary | ICD-10-CM | POA: Diagnosis not present

## 2022-06-29 DIAGNOSIS — Z6835 Body mass index (BMI) 35.0-35.9, adult: Secondary | ICD-10-CM | POA: Diagnosis not present

## 2022-06-29 DIAGNOSIS — E7849 Other hyperlipidemia: Secondary | ICD-10-CM | POA: Diagnosis not present

## 2022-06-29 DIAGNOSIS — I10 Essential (primary) hypertension: Secondary | ICD-10-CM | POA: Diagnosis not present

## 2022-08-20 DIAGNOSIS — Z79891 Long term (current) use of opiate analgesic: Secondary | ICD-10-CM | POA: Diagnosis not present

## 2022-08-20 DIAGNOSIS — M545 Low back pain, unspecified: Secondary | ICD-10-CM | POA: Diagnosis not present

## 2022-08-20 DIAGNOSIS — M79622 Pain in left upper arm: Secondary | ICD-10-CM | POA: Diagnosis not present

## 2022-08-20 DIAGNOSIS — R69 Illness, unspecified: Secondary | ICD-10-CM | POA: Diagnosis not present

## 2022-10-07 DIAGNOSIS — K219 Gastro-esophageal reflux disease without esophagitis: Secondary | ICD-10-CM | POA: Diagnosis not present

## 2022-10-07 DIAGNOSIS — Z0000 Encounter for general adult medical examination without abnormal findings: Secondary | ICD-10-CM | POA: Diagnosis not present

## 2022-10-07 DIAGNOSIS — R1030 Lower abdominal pain, unspecified: Secondary | ICD-10-CM | POA: Diagnosis not present

## 2022-10-07 DIAGNOSIS — E1169 Type 2 diabetes mellitus with other specified complication: Secondary | ICD-10-CM | POA: Diagnosis not present

## 2022-10-07 DIAGNOSIS — Z6835 Body mass index (BMI) 35.0-35.9, adult: Secondary | ICD-10-CM | POA: Diagnosis not present

## 2022-10-07 DIAGNOSIS — E7849 Other hyperlipidemia: Secondary | ICD-10-CM | POA: Diagnosis not present

## 2022-10-07 DIAGNOSIS — I10 Essential (primary) hypertension: Secondary | ICD-10-CM | POA: Diagnosis not present

## 2022-11-10 DIAGNOSIS — M5416 Radiculopathy, lumbar region: Secondary | ICD-10-CM | POA: Diagnosis not present

## 2022-11-10 DIAGNOSIS — Z79891 Long term (current) use of opiate analgesic: Secondary | ICD-10-CM | POA: Diagnosis not present

## 2022-11-10 DIAGNOSIS — G894 Chronic pain syndrome: Secondary | ICD-10-CM | POA: Diagnosis not present

## 2022-11-10 DIAGNOSIS — M25511 Pain in right shoulder: Secondary | ICD-10-CM | POA: Diagnosis not present

## 2022-11-18 DIAGNOSIS — J069 Acute upper respiratory infection, unspecified: Secondary | ICD-10-CM | POA: Diagnosis not present

## 2022-11-18 DIAGNOSIS — Z20828 Contact with and (suspected) exposure to other viral communicable diseases: Secondary | ICD-10-CM | POA: Diagnosis not present

## 2022-11-18 DIAGNOSIS — J329 Chronic sinusitis, unspecified: Secondary | ICD-10-CM | POA: Diagnosis not present

## 2022-12-22 DIAGNOSIS — M5416 Radiculopathy, lumbar region: Secondary | ICD-10-CM | POA: Diagnosis not present

## 2022-12-22 DIAGNOSIS — G894 Chronic pain syndrome: Secondary | ICD-10-CM | POA: Diagnosis not present

## 2022-12-22 DIAGNOSIS — Z79899 Other long term (current) drug therapy: Secondary | ICD-10-CM | POA: Diagnosis not present

## 2022-12-22 DIAGNOSIS — Z79891 Long term (current) use of opiate analgesic: Secondary | ICD-10-CM | POA: Diagnosis not present

## 2022-12-22 DIAGNOSIS — M5459 Other low back pain: Secondary | ICD-10-CM | POA: Diagnosis not present

## 2022-12-22 DIAGNOSIS — M25511 Pain in right shoulder: Secondary | ICD-10-CM | POA: Diagnosis not present

## 2023-01-06 DIAGNOSIS — E1169 Type 2 diabetes mellitus with other specified complication: Secondary | ICD-10-CM | POA: Diagnosis not present

## 2023-01-06 DIAGNOSIS — I10 Essential (primary) hypertension: Secondary | ICD-10-CM | POA: Diagnosis not present

## 2023-01-06 DIAGNOSIS — K219 Gastro-esophageal reflux disease without esophagitis: Secondary | ICD-10-CM | POA: Diagnosis not present

## 2023-01-06 DIAGNOSIS — Z6835 Body mass index (BMI) 35.0-35.9, adult: Secondary | ICD-10-CM | POA: Diagnosis not present

## 2023-01-06 DIAGNOSIS — E7849 Other hyperlipidemia: Secondary | ICD-10-CM | POA: Diagnosis not present

## 2023-01-06 DIAGNOSIS — I83893 Varicose veins of bilateral lower extremities with other complications: Secondary | ICD-10-CM | POA: Diagnosis not present

## 2023-01-19 DIAGNOSIS — G894 Chronic pain syndrome: Secondary | ICD-10-CM | POA: Diagnosis not present

## 2023-01-19 DIAGNOSIS — M5459 Other low back pain: Secondary | ICD-10-CM | POA: Diagnosis not present

## 2023-01-19 DIAGNOSIS — M25511 Pain in right shoulder: Secondary | ICD-10-CM | POA: Diagnosis not present

## 2023-01-19 DIAGNOSIS — M5416 Radiculopathy, lumbar region: Secondary | ICD-10-CM | POA: Diagnosis not present

## 2023-01-21 ENCOUNTER — Other Ambulatory Visit: Payer: Self-pay | Admitting: *Deleted

## 2023-01-21 DIAGNOSIS — I8393 Asymptomatic varicose veins of bilateral lower extremities: Secondary | ICD-10-CM

## 2023-01-28 ENCOUNTER — Ambulatory Visit (HOSPITAL_COMMUNITY): Payer: 59

## 2023-02-18 ENCOUNTER — Ambulatory Visit (HOSPITAL_COMMUNITY)
Admission: RE | Admit: 2023-02-18 | Discharge: 2023-02-18 | Disposition: A | Discharge status: Home / Self Care | Payer: 59 | Source: Ambulatory Visit | Attending: Physician Assistant | Admitting: Physician Assistant

## 2023-02-18 ENCOUNTER — Ambulatory Visit: Payer: 59 | Admitting: Physician Assistant

## 2023-02-18 VITALS — BP 109/66 | HR 80 | Temp 98.4°F | Resp 18 | Ht 64.0 in | Wt 210.6 lb

## 2023-02-18 DIAGNOSIS — I8393 Asymptomatic varicose veins of bilateral lower extremities: Secondary | ICD-10-CM | POA: Insufficient documentation

## 2023-02-18 DIAGNOSIS — I872 Venous insufficiency (chronic) (peripheral): Secondary | ICD-10-CM

## 2023-02-18 NOTE — Progress Notes (Signed)
Requested by:  Toma Deiters, MD [address] [place],  [place] [address]  Reason for consultation: varicose veins    History of Present Illness   Stephanie Khan is a 62 y.o. ([birth year]) female who presents for evaluation of bilateral varicose and spider veins. She explains that they have been present for several years but increasing. In particular she has a large cluster of veins behind her right knee that she is concerned she will develop a blood clot in. She gets swelling in this area at times. She also does occasionally have swelling in her legs as well as itching in area of spider veins. She does not regularly elevate and has not worn compression stockings before. 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 She has no history of DVT. No family history of DVT. She does report varicose veins in her aunts.   Venous symptoms include: aching, itching, swelling Onset/duration:  several years  Occupation:  CNA Aggravating factors: prolonged ambulation, standing Alleviating factors: none Compression:  no Helps:  unsure Pain medications:  none Previous vein procedures:  no History of DVT:  no  Past Medical History:  Diagnosis Date   Asthma    COPD (chronic obstructive pulmonary disease) (HCC)    Depression    Diabetes mellitus    Restless leg    S/P colonoscopy    Dr. Allena Katz in [place] 2 years ago    Past Surgical History:  Procedure Laterality Date   back operation     L4-L5   CHOLECYSTECTOMY     COLONOSCOPY WITH PROPOFOL N/A 02/14/2016   Procedure: COLONOSCOPY WITH PROPOFOL;  Surgeon: Malissa Hippo, MD;  Location: [facility];  Service: Endoscopy;  Laterality: N/A;  11:25-moved to 10:25 Ann to notify pt to arrive at 9:00am   ORIF HUMERUS FRACTURE Left 11/10/2018   Procedure: OPEN REDUCTION INTERNAL FIXATION (ORIF) HUMERAL SHAFT FRACTURE;  Surgeon: Vickki Hearing, MD;  Location: [facility] ORS;  Service: Orthopedics;  Laterality: Left;   POLYPECTOMY  02/14/2016    Procedure: POLYPECTOMY;  Surgeon: Malissa Hippo, MD;  Location: [facility];  Service: Endoscopy;;  cold snare multiple polyps   TUBAL LIGATION      Social History   Socioeconomic History   Marital status: Widowed    Spouse name: Not on file   Number of children: Not on file   Years of education: Not on file   Highest education level: Not on file  Occupational History    Comment: CNA   Tobacco Use   Smoking status: Every Day    Packs/day: 1.00    Years: 35.00    Additional pack years: 0.00    Total pack years: 35.00    Types: Cigarettes   Smokeless tobacco: Never  Vaping Use   Vaping Use: Never used  Substance and Sexual Activity   Alcohol use: Yes    Comment: rare   Drug use: No   Sexual activity: Not Currently    Birth control/protection: Post-menopausal, Surgical    Comment: tubal  Other Topics Concern   Not on file  Social History Narrative   Not on file   Social Determinants of Health   Financial Resource Strain: Patient Declined (05/06/2022)   Overall Financial Resource Strain (CARDIA)    Difficulty of Paying Living Expenses: Patient declined  Food Insecurity: No Food Insecurity (05/06/2022)   Hunger Vital Sign    Worried About Running Out of Food  in the Last Year: Never true    Ran Out of Food in the Last Year: Never true  Transportation Needs: No Transportation Needs (05/06/2022)   PRAPARE - Administrator, Civil Service (Medical): No    Lack of Transportation (Non-Medical): No  Physical Activity: Sufficiently Active (05/06/2022)   Exercise Vital Sign    Days of Exercise per Week: 6 days    Minutes of Exercise per Session: 60 min  Stress: No Stress Concern Present (05/06/2022)   [employer] of Occupational Health - Occupational Stress Questionnaire    Feeling of Stress : Not at all  Social Connections: Moderately Isolated (05/06/2022)   Social Connection and Isolation Panel [NHANES]    Frequency of Communication with Friends and Family:  More than three times a week    Frequency of Social Gatherings with Friends and Family: Three times a week    Attends Religious Services: More than 4 times per year    Active Member of Clubs or Organizations: No    Attends Banker Meetings: Never    Marital Status: Widowed  Intimate Partner Violence: Not At Risk (05/06/2022)   Humiliation, Afraid, Rape, and Kick questionnaire    Fear of Current or Ex-Partner: No    Emotionally Abused: No    Physically Abused: No    Sexually Abused: No    Family History  Problem Relation Age of Onset   Diabetes Mother    Diabetes Maternal Aunt    Colon cancer Neg Hx     Current Outpatient Medications  Medication Sig Dispense Refill   acidophilus (RISAQUAD) CAPS capsule Take 1 capsule by mouth daily.     aspirin EC 81 MG tablet Take 1 tablet (81 mg total) by mouth daily.     Calcium Carbonate (CALCIUM 600 PO) Take 1 tablet by mouth daily.     empagliflozin (JARDIANCE) 10 MG TABS tablet Take 10 mg by mouth daily.     glipiZIDE (GLUCOTROL XL) 10 MG 24 hr tablet Take 10 mg by mouth daily.     LANTUS SOLOSTAR 100 UNIT/ML Solostar Pen Inject 22 Units into the skin every morning. 20 units at night     metFORMIN (GLUCOPHAGE) 1000 MG tablet Take 1,000 mg by mouth 2 (two) times daily with a meal.       Multiple Vitamin (MULTIVITAMIN) tablet Take 1 tablet by mouth daily.     Omega-3 Fatty Acids (FISH OIL PO) Take by mouth.     oxyCODONE-acetaminophen (PERCOCET) 10-325 MG tablet Take 1 tablet by mouth every 4 (four) hours as needed for pain. 42 tablet 0   pravastatin (PRAVACHOL) 20 MG tablet      sulfamethoxazole-trimethoprim (BACTRIM DS) 800-160 MG tablet Take 1 tablet by mouth 2 (two) times daily. Take 1 bid 14 tablet 0   No current facility-administered medications for this visit.    No Known Allergies  REVIEW OF SYSTEMS (negative unless checked):   Cardiac:  []  Chest pain or chest pressure? []  Shortness of breath upon activity? []   Shortness of breath when lying flat? []  Irregular heart rhythm?  Vascular:  []  Pain in calf, thigh, or hip brought on by walking? []  Pain in feet at night that wakes you up from your sleep? []  Blood clot in your veins? []  Leg swelling?  Pulmonary:  []  Oxygen at home? []  Productive cough? []  Wheezing?  Neurologic:  []  Sudden weakness in arms or legs? []  Sudden numbness in arms or legs? []  Sudden onset of  difficult speaking or slurred speech? []  Temporary loss of vision in one eye? []  Problems with dizziness?  Gastrointestinal:  []  Blood in stool? []  Vomited blood?  Genitourinary:  []  Burning when urinating? []  Blood in urine?  Psychiatric:  []  Major depression  Hematologic:  []  Bleeding problems? []  Problems with blood clotting?  Dermatologic:  []  Rashes or ulcers?  Constitutional:  []  Fever or chills?  Ear/Nose/Throat:  []  Change in hearing? []  Nose bleeds? []  Sore throat?  Musculoskeletal:  []  Back pain? []  Joint pain? []  Muscle pain?   Physical Examination     Vitals:   02/18/23 1116  BP: 109/66  Pulse: 80  Resp: 18  Temp: 98.4 F (36.9 C)  TempSrc: Temporal  SpO2: (!) 9%  Weight: 210 lb 9.6 oz (95.5 kg)  Height: 5\' 4"  (1.626 m)   Body mass index is 36.15 kg/m.  General:  WDWN in NAD; vital signs documented above Gait: Normal HENT: WNL, normocephalic Pulmonary: normal non-labored breathing , without Rales, rhonchi,  wheezing Cardiac: regular HR Abdomen: soft, NT Vascular Exam/Pulses:2+ radial, 2+ DP and PT pulses bilaterally. Feet warm and well perfused Extremities: with varicose veins posterior right thigh, popliteal fossa, with reticular veins bilateral lower extremities, multiple spider veins, without edema, without stasis pigmentation, without lipodermatosclerosis, without ulcers     Musculoskeletal: no muscle wasting or atrophy  Neurologic: A&O X 3;  No focal weakness or paresthesias are detected Psychiatric:  The pt has  Normal affect.  Non-invasive Vascular Imaging   BLE Venous Insufficiency Duplex (02/18/23):  RLE:  No DVT and SVT  GSV reflux SFJ to proximal calf GSV diameter 0.37 cm -0.69 cm No SSV reflux CFV deep venous reflux AASV present but no reflux  Medical Decision Making   Stephanie Khan is a 62 y.o. female who presents with: RLE chronic venous insufficiency with spider veins. Provided reassurance that she does not have any blood clots and is not at risk for losing her legs. Her duplex today shows not DVT or Svt. 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 Her vein is of adequate size to be considered for venous ablation. She also would be candidate for stripping of larger cluster of varicose veins behind her right knee In the popliteal fossa.  She would like to try conservative therapy at this time and is not interested in any procedures.  Based on the patient's history and examination, I recommend: daily elevation above level of her heart, compression stockings, exercise, weight reduction, refraining from prolonged sitting or standing. I discussed with the patient the use of her 20-30 mm thigh high compression stockings. She understands she will need a 3 month trial of these before being considered for any intervention She is not interested in any intervention at this time. She will call if she has any new or worsening symptoms   Graceann Congress, PA-C [facility] Office: [phone]  02/18/2023, 11:19 AM  Clinic MD: Edilia Bo

## 2023-02-23 DIAGNOSIS — G894 Chronic pain syndrome: Secondary | ICD-10-CM | POA: Diagnosis not present

## 2023-02-23 DIAGNOSIS — M5459 Other low back pain: Secondary | ICD-10-CM | POA: Diagnosis not present

## 2023-02-23 DIAGNOSIS — Z79899 Other long term (current) drug therapy: Secondary | ICD-10-CM | POA: Diagnosis not present

## 2023-02-23 DIAGNOSIS — M25511 Pain in right shoulder: Secondary | ICD-10-CM | POA: Diagnosis not present

## 2023-02-23 DIAGNOSIS — M5416 Radiculopathy, lumbar region: Secondary | ICD-10-CM | POA: Diagnosis not present

## 2023-02-23 DIAGNOSIS — Z79891 Long term (current) use of opiate analgesic: Secondary | ICD-10-CM | POA: Diagnosis not present

## 2023-03-23 DIAGNOSIS — M5459 Other low back pain: Secondary | ICD-10-CM | POA: Diagnosis not present

## 2023-03-23 DIAGNOSIS — M5416 Radiculopathy, lumbar region: Secondary | ICD-10-CM | POA: Diagnosis not present

## 2023-03-23 DIAGNOSIS — G894 Chronic pain syndrome: Secondary | ICD-10-CM | POA: Diagnosis not present

## 2023-03-23 DIAGNOSIS — M25511 Pain in right shoulder: Secondary | ICD-10-CM | POA: Diagnosis not present

## 2023-05-11 DIAGNOSIS — E1143 Type 2 diabetes mellitus with diabetic autonomic (poly)neuropathy: Secondary | ICD-10-CM | POA: Diagnosis not present

## 2023-05-11 DIAGNOSIS — K219 Gastro-esophageal reflux disease without esophagitis: Secondary | ICD-10-CM | POA: Diagnosis not present

## 2023-05-11 DIAGNOSIS — I83893 Varicose veins of bilateral lower extremities with other complications: Secondary | ICD-10-CM | POA: Diagnosis not present

## 2023-05-11 DIAGNOSIS — E7849 Other hyperlipidemia: Secondary | ICD-10-CM | POA: Diagnosis not present

## 2023-05-11 DIAGNOSIS — Z6835 Body mass index (BMI) 35.0-35.9, adult: Secondary | ICD-10-CM | POA: Diagnosis not present

## 2023-05-11 DIAGNOSIS — I10 Essential (primary) hypertension: Secondary | ICD-10-CM | POA: Diagnosis not present

## 2023-05-11 DIAGNOSIS — E1169 Type 2 diabetes mellitus with other specified complication: Secondary | ICD-10-CM | POA: Diagnosis not present

## 2023-05-11 DIAGNOSIS — F408 Other phobic anxiety disorders: Secondary | ICD-10-CM | POA: Diagnosis not present

## 2023-05-18 DIAGNOSIS — M5416 Radiculopathy, lumbar region: Secondary | ICD-10-CM | POA: Diagnosis not present

## 2023-05-18 DIAGNOSIS — Z79891 Long term (current) use of opiate analgesic: Secondary | ICD-10-CM | POA: Diagnosis not present

## 2023-05-18 DIAGNOSIS — G894 Chronic pain syndrome: Secondary | ICD-10-CM | POA: Diagnosis not present

## 2023-05-18 DIAGNOSIS — M5459 Other low back pain: Secondary | ICD-10-CM | POA: Diagnosis not present

## 2023-05-18 DIAGNOSIS — Z79899 Other long term (current) drug therapy: Secondary | ICD-10-CM | POA: Diagnosis not present

## 2023-05-18 DIAGNOSIS — M25511 Pain in right shoulder: Secondary | ICD-10-CM | POA: Diagnosis not present

## 2023-06-17 ENCOUNTER — Encounter: Payer: Self-pay | Admitting: Adult Health

## 2023-06-17 ENCOUNTER — Ambulatory Visit (INDEPENDENT_AMBULATORY_CARE_PROVIDER_SITE_OTHER): Payer: 59 | Admitting: Adult Health

## 2023-06-17 VITALS — BP 126/73 | HR 78 | Ht 63.0 in | Wt 207.0 lb

## 2023-06-17 DIAGNOSIS — Z01419 Encounter for gynecological examination (general) (routine) without abnormal findings: Secondary | ICD-10-CM | POA: Diagnosis not present

## 2023-06-17 DIAGNOSIS — N816 Rectocele: Secondary | ICD-10-CM | POA: Diagnosis not present

## 2023-06-17 DIAGNOSIS — Z1330 Encounter for screening examination for mental health and behavioral disorders, unspecified: Secondary | ICD-10-CM

## 2023-06-17 DIAGNOSIS — Z1211 Encounter for screening for malignant neoplasm of colon: Secondary | ICD-10-CM

## 2023-06-17 LAB — HEMOCCULT GUIAC POC 1CARD (OFFICE): Fecal Occult Blood, POC: NEGATIVE

## 2023-06-17 NOTE — Progress Notes (Signed)
Patient ID: LLONA PONCEDELEON, female   DOB: [birth year], 62 y.o.   MRN: [id number] History of Present Illness: Acelin is a 62 year old white female, widowed, PM in for a well woman gyn exam. She is caring for her 108 yo mom who fell and fractured her hip.      Component Value Date/Time   DIAGPAP  05/06/2022 1558    - Negative for intraepithelial lesion or malignancy (NILM)   DIAGPAP  01/02/2020 1528    - Negative for intraepithelial lesion or malignancy (NILM)   DIAGPAP  04/26/2017 0000    NEGATIVE FOR INTRAEPITHELIAL LESIONS OR MALIGNANCY.   HPVHIGH Negative 05/06/2022 1558   HPVHIGH Negative 01/02/2020 1528   ADEQPAP  05/06/2022 1558    Satisfactory for evaluation; transformation zone component ABSENT.   ADEQPAP  01/02/2020 1528    Satisfactory for evaluation; transformation zone component ABSENT.   ADEQPAP  04/26/2017 0000    Satisfactory for evaluation  endocervical/transformation zone component ABSENT.    PCP is Dr Olena Leatherwood   Current Medications, Allergies, Past Medical History, Past Surgical History, Family History and Social History were reviewed in [employer] electronic medical record.     Review of Systems: Patient denies any headaches,  fatigue, blurred vision, shortness of breath, chest pain, abdominal pain, problems with bowel movements, urination, or intercourse(not active). No joint pain or mood swings.  Has hearing aide Denies any  vaginal bleeding   Physical Exam:BP 126/73 (BP Location: Right Arm, Patient Position: Sitting, Cuff Size: Large)   Pulse 78   Ht 5\' 3"  (1.6 m)   Wt 207 lb (93.9 kg)   BMI 36.67 kg/m   General:  Well developed, well nourished, no acute distress Skin:  Warm and dry Neck:  Midline trachea, normal thyroid, good ROM, no lymphadenopathy,no carotid bruits heard Lungs; Clear to auscultation bilaterally Breast:  No dominant palpable mass, retraction, or nipple discharge Cardiovascular: Regular rate and rhythm Abdomen:  Soft, non tender, no  hepatosplenomegaly Pelvic:  External genitalia is normal in appearance, has several angiokeratomas.  The vagina is pale.Urethra has no lesions or masses. The cervix is smooth.  Uterus is felt to be normal size, shape, and contour.  No adnexal masses or tenderness noted.Bladder is non tender, no masses felt. Rectal: Good sphincter tone, no polyps, or hemorrhoids felt.  Hemoccult negative.+rectocele Extremities/musculoskeletal:  No swelling, +spider veins noted, no clubbing or cyanosis Psych:  No mood changes, alert and cooperative,seems happy AA is 1 Fall risk is low    06/17/2023    1:41 PM 05/06/2022    3:47 PM 01/02/2020    2:58 PM  Depression screen PHQ 2/9  Decreased Interest 0 0 0  Down, Depressed, Hopeless 0 0 0  PHQ - 2 Score 0 0 0  Altered sleeping 0 0 0  Tired, decreased energy 0 0 0  Change in appetite 0 0 0  Feeling bad or failure about yourself  0 0 0  Trouble concentrating 0 0 0  Moving slowly or fidgety/restless 0 0 0  Suicidal thoughts 0 0 0  PHQ-9 Score 0 0 0  Difficult doing work/chores   Not difficult at all       06/17/2023    1:42 PM 05/06/2022    3:47 PM 01/02/2020    2:58 PM  GAD 7 : Generalized Anxiety Score  Nervous, Anxious, on Edge 0 0 0  Control/stop worrying 0 0 0  Worry too much - different things 0 0 0  Trouble relaxing  0 0 0  Restless 0 0 0  Easily annoyed or irritable 0 0 0  Afraid - awful might happen 0 0 0  Total GAD 7 Score 0 0 0  Anxiety Difficulty   Not difficult at all    Upstream - 06/17/23 1340       Pregnancy Intention Screening   Does the patient want to become pregnant in the next year? N/A    Does the patient's partner want to become pregnant in the next year? N/A    Would the patient like to discuss contraceptive options today? N/A      Contraception Wrap Up   Current Method Female Sterilization   pm   End Method Female Sterilization   PM   Contraception Counseling Provided No            Examination chaperoned by Malachy Mood LPN     Impression and Plan: 1. Encounter for well woman exam with routine gynecological exam Physical in 1 year Pap in 2026 Mammogram was negative 06/15/22 Labs with PCP Colonoscopy per GI  2. Encounter for screening fecal occult blood testing Hemoccult was negative   3. Rectocele

## 2023-06-22 ENCOUNTER — Other Ambulatory Visit (HOSPITAL_COMMUNITY): Payer: Self-pay | Admitting: Adult Health

## 2023-06-22 DIAGNOSIS — Z1231 Encounter for screening mammogram for malignant neoplasm of breast: Secondary | ICD-10-CM

## 2023-06-28 ENCOUNTER — Encounter (HOSPITAL_COMMUNITY): Payer: Self-pay

## 2023-06-28 ENCOUNTER — Ambulatory Visit (HOSPITAL_COMMUNITY)
Admission: RE | Admit: 2023-06-28 | Discharge: 2023-06-28 | Disposition: A | Discharge status: Home / Self Care | Payer: 59 | Source: Ambulatory Visit | Attending: Adult Health | Admitting: Adult Health

## 2023-06-28 DIAGNOSIS — Z1231 Encounter for screening mammogram for malignant neoplasm of breast: Secondary | ICD-10-CM | POA: Diagnosis not present

## 2023-06-30 ENCOUNTER — Telehealth: Payer: Self-pay | Admitting: *Deleted

## 2023-06-30 NOTE — Telephone Encounter (Signed)
Left message @ 2:08 pm, letting pt know mammogram was negative and next one is due in 1 year. JSY

## 2023-06-30 NOTE — Telephone Encounter (Signed)
-----   Message from Cyril Mourning sent at 06/30/2023  2:03 PM EDT ----- Let her know mammogram was negative

## 2023-07-27 DIAGNOSIS — M25511 Pain in right shoulder: Secondary | ICD-10-CM | POA: Diagnosis not present

## 2023-07-27 DIAGNOSIS — G894 Chronic pain syndrome: Secondary | ICD-10-CM | POA: Diagnosis not present

## 2023-07-27 DIAGNOSIS — M5416 Radiculopathy, lumbar region: Secondary | ICD-10-CM | POA: Diagnosis not present

## 2023-07-27 DIAGNOSIS — M5459 Other low back pain: Secondary | ICD-10-CM | POA: Diagnosis not present

## 2023-07-27 DIAGNOSIS — Z79891 Long term (current) use of opiate analgesic: Secondary | ICD-10-CM | POA: Diagnosis not present

## 2023-08-23 DIAGNOSIS — F408 Other phobic anxiety disorders: Secondary | ICD-10-CM | POA: Diagnosis not present

## 2023-08-23 DIAGNOSIS — I83893 Varicose veins of bilateral lower extremities with other complications: Secondary | ICD-10-CM | POA: Diagnosis not present

## 2023-08-23 DIAGNOSIS — K219 Gastro-esophageal reflux disease without esophagitis: Secondary | ICD-10-CM | POA: Diagnosis not present

## 2023-08-23 DIAGNOSIS — Z6836 Body mass index (BMI) 36.0-36.9, adult: Secondary | ICD-10-CM | POA: Diagnosis not present

## 2023-08-23 DIAGNOSIS — J449 Chronic obstructive pulmonary disease, unspecified: Secondary | ICD-10-CM | POA: Diagnosis not present

## 2023-08-23 DIAGNOSIS — I10 Essential (primary) hypertension: Secondary | ICD-10-CM | POA: Diagnosis not present

## 2023-08-23 DIAGNOSIS — E1143 Type 2 diabetes mellitus with diabetic autonomic (poly)neuropathy: Secondary | ICD-10-CM | POA: Diagnosis not present

## 2023-08-23 DIAGNOSIS — E7849 Other hyperlipidemia: Secondary | ICD-10-CM | POA: Diagnosis not present
